# Patient Record
Sex: Female | Born: 1950 | Race: White | Hispanic: No | Marital: Single | Smoking: Never smoker
Health system: Southern US, Community
[De-identification: ages and names within clinical notes are randomized; demographics above are authoritative.]

## PROBLEM LIST (undated history)

## (undated) DIAGNOSIS — C50919 Malignant neoplasm of unspecified site of unspecified female breast: Secondary | ICD-10-CM

## (undated) DIAGNOSIS — F32A Depression, unspecified: Secondary | ICD-10-CM

## (undated) DIAGNOSIS — K219 Gastro-esophageal reflux disease without esophagitis: Secondary | ICD-10-CM

## (undated) DIAGNOSIS — F329 Major depressive disorder, single episode, unspecified: Secondary | ICD-10-CM

## (undated) HISTORY — DX: Major depressive disorder, single episode, unspecified: F32.9

## (undated) HISTORY — PX: BREAST RECONSTRUCTION: SHX9

## (undated) HISTORY — DX: Gastro-esophageal reflux disease without esophagitis: K21.9

## (undated) HISTORY — DX: Depression, unspecified: F32.A

## (undated) HISTORY — PX: OTHER SURGICAL HISTORY: SHX169

## (undated) HISTORY — PX: TUBAL LIGATION: SHX77

## (undated) HISTORY — PX: BLEPHAROPLASTY: SUR158

---

## 1998-09-07 ENCOUNTER — Emergency Department (HOSPITAL_COMMUNITY): Admission: EM | Admit: 1998-09-07 | Discharge: 1998-09-07 | Payer: Self-pay | Admitting: Emergency Medicine

## 2000-09-06 ENCOUNTER — Encounter: Payer: Self-pay | Admitting: Family Medicine

## 2000-09-06 ENCOUNTER — Encounter: Admission: RE | Admit: 2000-09-06 | Discharge: 2000-09-06 | Payer: Self-pay | Admitting: Family Medicine

## 2003-05-20 ENCOUNTER — Other Ambulatory Visit: Admission: RE | Admit: 2003-05-20 | Discharge: 2003-05-20 | Payer: Self-pay | Admitting: Family Medicine

## 2005-07-06 ENCOUNTER — Encounter: Admission: RE | Admit: 2005-07-06 | Discharge: 2005-07-06 | Payer: Self-pay | Admitting: Family Medicine

## 2005-09-26 ENCOUNTER — Other Ambulatory Visit: Admission: RE | Admit: 2005-09-26 | Discharge: 2005-09-26 | Payer: Self-pay | Admitting: Family Medicine

## 2007-03-17 ENCOUNTER — Encounter: Admission: RE | Admit: 2007-03-17 | Discharge: 2007-03-17 | Payer: Self-pay | Admitting: Family Medicine

## 2009-07-17 ENCOUNTER — Emergency Department (HOSPITAL_COMMUNITY): Admission: EM | Admit: 2009-07-17 | Discharge: 2009-07-17 | Payer: Self-pay | Admitting: Family Medicine

## 2010-02-20 ENCOUNTER — Emergency Department (HOSPITAL_COMMUNITY): Admission: EM | Admit: 2010-02-20 | Discharge: 2010-02-20 | Payer: Self-pay | Admitting: Family Medicine

## 2010-06-20 ENCOUNTER — Emergency Department (HOSPITAL_COMMUNITY): Admission: EM | Admit: 2010-06-20 | Discharge: 2010-06-20 | Payer: Self-pay | Admitting: Emergency Medicine

## 2011-11-16 ENCOUNTER — Emergency Department (INDEPENDENT_AMBULATORY_CARE_PROVIDER_SITE_OTHER)
Admission: EM | Admit: 2011-11-16 | Discharge: 2011-11-16 | Disposition: A | Discharge status: Home / Self Care | Payer: BC Managed Care – PPO | Source: Home / Self Care | Attending: Emergency Medicine | Admitting: Emergency Medicine

## 2011-11-16 ENCOUNTER — Encounter (HOSPITAL_COMMUNITY): Payer: Self-pay

## 2011-11-16 ENCOUNTER — Emergency Department (INDEPENDENT_AMBULATORY_CARE_PROVIDER_SITE_OTHER): Payer: BC Managed Care – PPO

## 2011-11-16 DIAGNOSIS — S92309A Fracture of unspecified metatarsal bone(s), unspecified foot, initial encounter for closed fracture: Secondary | ICD-10-CM

## 2011-11-16 NOTE — Discharge Instructions (Signed)
Followup with orthopedic provider as discussed within 2-4 weeks. Redischarged instructions for further guidance and refresh information that we discussed

## 2011-11-16 NOTE — ED Notes (Signed)
Pt states she rolled her lt foot yesterday around 5:30 pm.  States applied ice and elevated. Pain in lt lateral foot with movement and weight bearing.

## 2011-11-16 NOTE — ED Provider Notes (Signed)
History     CSN: [id number]  Arrival date & time 11/16/11  0854   First MD Initiated Contact with Patient 11/16/11 0900      Chief Complaint  Patient presents with  . Foot Injury    (Consider location/radiation/quality/duration/timing/severity/associated sxs/prior treatment) HPI Comments: Patient was walking and rolled her  Left foot yesterday in the afternoon. Have been applying ice and keeping her foot elevated. She has no spontaneous pain pain only occurs when she weight bears or with foot movement. Patient describes area with a visible bruise. Denies any tingling numbness.  Patient is a 61 y.o. female presenting with foot injury. The history is provided by the patient.  Foot Injury  The incident occurred yesterday. The incident occurred at home. The injury mechanism was torsion. Pain location: L foot. The pain is at a severity of 4/10. The pain is moderate. The pain has been intermittent since onset. Associated symptoms include inability to bear weight. Pertinent negatives include no numbness, no muscle weakness, no loss of sensation and no tingling. She reports no foreign bodies present. The symptoms are aggravated by activity, bearing weight and palpation. She has tried nothing for the symptoms. The treatment provided no relief.    History reviewed. No pertinent past medical history.  History reviewed. No pertinent past surgical history.  No family history on file.  History  Substance Use Topics  . Smoking status: Never Smoker   . Smokeless tobacco: Not on file  . Alcohol Use: No    OB History    Grav Para Term Preterm Abortions TAB SAB Ect Mult Living                  Review of Systems  Constitutional: Positive for activity change. Negative for fever and chills.  HENT: Negative for rhinorrhea, neck pain and neck stiffness.   Eyes: Negative for pain.  Respiratory: Negative for shortness of breath.   Genitourinary: Negative for dysuria, urgency, flank pain and  enuresis.  Musculoskeletal: Negative for back pain.  Neurological: Negative for tingling, weakness and numbness.    Allergies  Review of patient's allergies indicates no known allergies.  Home Medications  No current outpatient prescriptions on file.  BP 134/78  Pulse 79  Temp(Src) 98.9 F (37.2 C) (Oral)  Resp 16  SpO2 99%  Physical Exam  Nursing note and vitals reviewed. Constitutional: She appears well-developed and well-nourished.  Non-toxic appearance. She does not have a sickly appearance. She does not appear ill. No distress.  HENT:  Head: Normocephalic.  Eyes: Conjunctivae are normal. Right eye exhibits no discharge. Left eye exhibits no discharge. No scleral icterus.  Neck: Neck supple. No JVD present.  Abdominal: There is no tenderness.  Musculoskeletal: She exhibits tenderness.       Left ankle: She exhibits decreased range of motion, swelling and ecchymosis. She exhibits no deformity, no laceration and normal pulse. tenderness. Achilles tendon normal.       Feet:  Lymphadenopathy:    She has no cervical adenopathy.  Neurological: She is alert.  Skin: No rash noted. No erythema.    ED Course  Procedures (including critical care time)  Labs Reviewed - No data to display Dg Foot Complete Left  11/16/2011  *RADIOLOGY REPORT*  Clinical Data: Twisting injury, pain at base of fifth metatarsal.  LEFT FOOT - COMPLETE 3+ VIEW  Comparison: None.  Findings: There is a minimally-displaced fracture through the base of the left fifth metatarsal.  Overlying soft tissue swelling.  No additional  acute bony abnormality.  IMPRESSION: Fracture through the base of the left fifth metatarsal.  Original Report Authenticated By: Cyndie Chime, M.D.     1. Metatarsal bone fracture       MDM  Nondisplaced fifth metatarsal fracture after a portion of her left foot. No neurovascular deficits. Patient brought with Cam Walker instructed to followup with orthopedic Dr. to 4 weeks.  Patient agreed with treatment plan and followup care        Jimmie Molly, MD 11/16/11 1215

## 2013-06-17 HISTORY — PX: BREAST BIOPSY: SHX20

## 2013-06-18 ENCOUNTER — Other Ambulatory Visit: Payer: Self-pay | Admitting: Radiology

## 2013-06-18 DIAGNOSIS — D0511 Intraductal carcinoma in situ of right breast: Secondary | ICD-10-CM

## 2013-06-22 ENCOUNTER — Ambulatory Visit
Admission: RE | Admit: 2013-06-22 | Discharge: 2013-06-22 | Disposition: A | Discharge status: Home / Self Care | Payer: 59 | Source: Ambulatory Visit | Attending: Radiology | Admitting: Radiology

## 2013-06-22 DIAGNOSIS — D0511 Intraductal carcinoma in situ of right breast: Secondary | ICD-10-CM

## 2013-06-22 MED ORDER — GADOBENATE DIMEGLUMINE 529 MG/ML IV SOLN
14.0000 mL | Freq: Once | INTRAVENOUS | Status: AC | PRN
  Administered 2013-06-22: 14 mL via INTRAVENOUS

## 2013-06-23 ENCOUNTER — Encounter (INDEPENDENT_AMBULATORY_CARE_PROVIDER_SITE_OTHER): Payer: Self-pay | Admitting: Surgery

## 2013-06-23 ENCOUNTER — Other Ambulatory Visit (INDEPENDENT_AMBULATORY_CARE_PROVIDER_SITE_OTHER): Payer: Self-pay | Admitting: Surgery

## 2013-06-23 ENCOUNTER — Encounter (INDEPENDENT_AMBULATORY_CARE_PROVIDER_SITE_OTHER): Payer: Self-pay

## 2013-06-23 ENCOUNTER — Ambulatory Visit (INDEPENDENT_AMBULATORY_CARE_PROVIDER_SITE_OTHER): Payer: 59 | Admitting: Surgery

## 2013-06-23 VITALS — BP 142/80 | HR 76 | Temp 98.0°F | Resp 14 | Ht 66.0 in | Wt 179.8 lb

## 2013-06-23 DIAGNOSIS — C50919 Malignant neoplasm of unspecified site of unspecified female breast: Secondary | ICD-10-CM

## 2013-06-23 DIAGNOSIS — D0511 Intraductal carcinoma in situ of right breast: Secondary | ICD-10-CM | POA: Insufficient documentation

## 2013-06-23 DIAGNOSIS — C50911 Malignant neoplasm of unspecified site of right female breast: Secondary | ICD-10-CM

## 2013-06-23 NOTE — Progress Notes (Signed)
Patient ID: Kristin Lewis, female   DOB: 05/08/1951, 62 y.o.   MRN: [id number]  Chief Complaint  Patient presents with  . New Evaluation    new br ca    HPI Kristin Lewis is a 62 y.o. female.  Referred by Dr. 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 The mammogram was on 06/01/13. This showed multiple heterogeneous calcifications in the superior lateral quadrant of the right breast in the anterior depth that were indeterminate.  She had further imaging on 11/11 which showed an oval mass in the right breast at 12:00 with 2 other similar-appearing masses nearby. There is a cluster of pleomorphic calcifications at 11:00. Ultrasound showed a complex cyst at 9:00 and several other cysts at 12:00. She then underwent stereotactic biopsy on 11/12. The pathology showed DCIS.  MRI on 11/17/14Showed diffuse low level enhancement throughout the right breast which is asymmetric compared to the left. The area of concern measures 9.8 x 7.7 x 5.9 cm involving all quadrants of the breast. Lymph nodes appeared negative. Left breast was negative. She presents now for surgical evaluation. She denies any symptoms at all in either breast. No pain, no nipple discharge, no skin changes.  Past Medical History  Diagnosis Date  . Depression   . GERD (gastroesophageal reflux disease)     Past Surgical History  Procedure Laterality Date  . Tummy tuck    . Eyelid lift    Dr. Holderness  History reviewed. No pertinent family history. Negative for cancer  Social History History  Substance Use Topics  . Smoking status: Never Smoker   . Smokeless tobacco: Never Used  . Alcohol Use: Yes    No Known Allergies  Current Outpatient Prescriptions  Medication Sig Dispense Refill  . buPROPion (WELLBUTRIN SR) 150 MG 12 hr tablet Take 150 mg by mouth 2 (two) times daily.      . sertraline (ZOLOFT)  100 MG tablet Take 100 mg by mouth daily.       No current facility-administered medications for this visit.    Review of Systems Review of Systems  Constitutional: Negative for fever, chills and unexpected weight change.  HENT: Negative for congestion, hearing loss, sore throat, trouble swallowing and voice change.   Eyes: Negative for visual disturbance.  Respiratory: Negative for cough and wheezing.   Cardiovascular: Negative for chest pain, palpitations and leg swelling.  Gastrointestinal: Negative for nausea, vomiting, abdominal pain, diarrhea, constipation, blood in stool, abdominal distention and anal bleeding.  Genitourinary: Negative for hematuria, vaginal bleeding and difficulty urinating.  Musculoskeletal: Negative for arthralgias.  Skin: Negative for rash and wound.  Neurological: Negative for seizures, syncope and headaches.  Hematological: Negative for adenopathy. Does not bruise/bleed easily.  Psychiatric/Behavioral: Negative for confusion.   Menarche age 12 Menopause age 52 Nulliparous OCP 6 years   Blood pressure 142/80, pulse 76, temperature 98 F (36.7 C), temperature source Temporal, resp. rate 14, height 5' 6" (1.676 m), weight 179 lb 12.8 oz (81.557 kg).  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     Plan    We discussed her current diagnosis as  well as treatment options. Currently, the biopsy shows only DCIS. However the MRI shows a very wide area of abnormal enhancement. If we were to pursue breast conserving therapy, we would have to do further needle biopsies to rule out DCIS in the entire area of enhancement. She would have a very poor cosmetic outcome if we were to try to excise the entire area. Her risk of needing reexcision would be much higher as well.  The other surgical option would be to pursue a mastectomy with sentinel lymph node biopsy. The patient is very interested in pursuing this course of treatment. As well, she is requesting a prophylactic left mastectomy. Her current health status is excellent and she would rather have a left mastectomy with bilateral reconstruction at this time. I did inform her that this is a reasonable approach but a prophylactic mastectomy does not give any survival benefit. She has had previous plastic surgery by Dr. Holderness and wishes to consult with him regarding immediate reconstruction. She understands that at this time it is difficult to determine whether she might need chemotherapy, hormonal therapy, or possibly radiation therapy. A mastectomy would decrease her chance of needing to have radiation.  The patient has made the decision that she wants to undergo bilateral mastectomies. We will perform a right total mastectomy with sentinel lymph node biopsy and a prophylactic left mastectomy.The surgical procedure has been discussed with the patient.  Potential risks, benefits, alternative treatments, and expected outcomes have been explained.  All of the patient's questions at this time have been answered.  The likelihood of reaching the patient's treatment goal is good.  The patient understand the proposed surgical procedure and wishes to proceed. We will await Dr. Holderness' evaluation before scheduling the surgery.        Donjuan Robison K. 06/23/2013, 12:31 PM    

## 2013-06-23 NOTE — Addendum Note (Signed)
Addended by: Wynona Luna on: 06/23/2013 02:17 PM   Modules accepted: Orders

## 2013-06-25 ENCOUNTER — Encounter (INDEPENDENT_AMBULATORY_CARE_PROVIDER_SITE_OTHER): Payer: Self-pay

## 2013-06-30 ENCOUNTER — Other Ambulatory Visit: Payer: Self-pay | Admitting: Family Medicine

## 2013-06-30 DIAGNOSIS — R102 Pelvic and perineal pain: Secondary | ICD-10-CM

## 2013-07-06 ENCOUNTER — Ambulatory Visit
Admission: RE | Admit: 2013-07-06 | Discharge: 2013-07-06 | Disposition: A | Discharge status: Home / Self Care | Payer: 59 | Source: Ambulatory Visit | Attending: Family Medicine | Admitting: Family Medicine

## 2013-07-06 DIAGNOSIS — R102 Pelvic and perineal pain: Secondary | ICD-10-CM

## 2013-07-10 ENCOUNTER — Encounter (HOSPITAL_COMMUNITY)
Admission: RE | Admit: 2013-07-10 | Discharge: 2013-07-10 | Disposition: A | Discharge status: Home / Self Care | Payer: 59 | Source: Ambulatory Visit | Attending: Surgery | Admitting: Surgery

## 2013-07-10 ENCOUNTER — Encounter (HOSPITAL_COMMUNITY): Payer: Self-pay

## 2013-07-10 DIAGNOSIS — Z01812 Encounter for preprocedural laboratory examination: Secondary | ICD-10-CM | POA: Insufficient documentation

## 2013-07-10 DIAGNOSIS — Z01818 Encounter for other preprocedural examination: Secondary | ICD-10-CM | POA: Insufficient documentation

## 2013-07-10 LAB — COMPREHENSIVE METABOLIC PANEL
ALT: 23 U/L (ref 0–35)
Albumin: 3.8 g/dL (ref 3.5–5.2)
Alkaline Phosphatase: 88 U/L (ref 39–117)
Chloride: 104 mEq/L (ref 96–112)
GFR calc Af Amer: 61 mL/min — ABNORMAL LOW (ref >90)
Glucose, Bld: 99 mg/dL (ref 70–99)
Potassium: 3.6 mEq/L (ref 3.5–5.1)
Sodium: 140 mEq/L (ref 135–145)
Total Protein: 7.5 g/dL (ref 6.0–8.3)

## 2013-07-10 LAB — CBC
HCT: 38.8 % (ref 36.0–46.0)
MCHC: 34 g/dL (ref 30.0–36.0)
MCV: 88.8 fL (ref 78.0–100.0)
Platelets: 296 10*3/uL (ref 150–400)
RBC: 4.37 MIL/uL (ref 3.87–5.11)
RDW: 12.7 % (ref 11.5–15.5)
WBC: 8.7 10*3/uL (ref 4.0–10.5)

## 2013-07-10 NOTE — Pre-Procedure Instructions (Signed)
Kristin Lewis  07/10/2013   Your procedure is scheduled on: Dec.10 Report to [facility]  2 * 3 at 0900 AM.  Call this number if you have problems the morning of surgery: [phone]   Remember:   Do not eat food or drink liquids after midnight.   Take these medicines the morning of surgery with A SIP OF WATER: Zoloft and Wellbutrin  Stop taking Aspirin, Aleve, Ibuprofen, Herbel medications, Fish oil, BC's, Goody's   Do not wear jewelry, make-up or nail polish.  Do not wear lotions, powders, or perfumes. You may wear deodorant.  Do not shave 48 hours prior to surgery. Men may shave face and neck.  Do not bring valuables to the hospital.  [facility] is not responsible                  for any belongings or valuables.               Contacts, dentures or bridgework may not be worn into surgery.  Leave suitcase in the car. After surgery it may be brought to your room.  For patients admitted to the hospital, discharge time is determined by your                treatment team.               Patients discharged the day of surgery will not be allowed to drive  home.    Special Instructions: Shower using CHG 2 nights before surgery and the night before surgery.  If you shower the day of surgery use CHG.  Use special wash - you have one bottle of CHG for all showers.  You should use approximately 1/3 of the bottle for each shower.   Please read over the following fact sheets that you were given: Pain Booklet, Coughing and Deep Breathing and Surgical Site Infection Prevention

## 2013-07-10 NOTE — Progress Notes (Signed)
PCP is Dr Duanne Guess Denies seeing a cardiologist. Denies having a stress test, echo, or card cath.

## 2013-07-13 ENCOUNTER — Telehealth (INDEPENDENT_AMBULATORY_CARE_PROVIDER_SITE_OTHER): Payer: Self-pay | Admitting: General Surgery

## 2013-07-13 NOTE — Telephone Encounter (Signed)
Called patient back and talked to her about her questions that she sent me a e-mail, she wanted to know about the placement about the wire placed at,she didn't know if she comes here at the office to have that placed here, I told her that she will have that done at the hospital at 9:00, and she wanted to know about the drains. I told her that Dr Corliss Skains give her everything when she leaves the hospital and she will have to write down the amount on the paper. I also told her that Dr Corliss Skains will also let me know if she needs to come in before the 08-05-2013, to him or we will do a nurse only visit. Patient was understand all that I talked to her about and she can call anytime if she has any questions

## 2013-07-14 MED ORDER — CEFAZOLIN SODIUM-DEXTROSE 2-3 GM-% IV SOLR
2.0000 g | INTRAVENOUS | Status: AC
  Administered 2013-07-15: 2 g via INTRAVENOUS
  Filled 2013-07-14 (×2): qty 50

## 2013-07-14 MED ORDER — CHLORHEXIDINE GLUCONATE 4 % EX LIQD: 1.0000 "application " | Freq: Once | CUTANEOUS | Status: DC

## 2013-07-15 ENCOUNTER — Encounter (HOSPITAL_COMMUNITY): Payer: 59 | Admitting: Certified Registered Nurse Anesthetist

## 2013-07-15 ENCOUNTER — Encounter (HOSPITAL_COMMUNITY): Payer: Self-pay | Admitting: Certified Registered Nurse Anesthetist

## 2013-07-15 ENCOUNTER — Encounter (HOSPITAL_COMMUNITY)
Admission: RE | Disposition: A | Discharge status: Home / Self Care | Payer: Self-pay | Source: Ambulatory Visit | Attending: Surgery

## 2013-07-15 ENCOUNTER — Observation Stay (HOSPITAL_COMMUNITY)
Admission: RE | Admit: 2013-07-15 | Discharge: 2013-07-17 | Disposition: A | Discharge status: Home / Self Care | Payer: 59 | Source: Ambulatory Visit | Attending: Surgery | Admitting: Surgery

## 2013-07-15 ENCOUNTER — Inpatient Hospital Stay (HOSPITAL_COMMUNITY): Payer: 59 | Admitting: Certified Registered Nurse Anesthetist

## 2013-07-15 ENCOUNTER — Encounter (HOSPITAL_COMMUNITY)
Admission: RE | Admit: 2013-07-15 | Discharge: 2013-07-15 | Disposition: A | Discharge status: Home / Self Care | Payer: 59 | Source: Ambulatory Visit | Attending: Surgery | Admitting: Surgery

## 2013-07-15 DIAGNOSIS — Z4001 Encounter for prophylactic removal of breast: Secondary | ICD-10-CM | POA: Insufficient documentation

## 2013-07-15 DIAGNOSIS — Z853 Personal history of malignant neoplasm of breast: Secondary | ICD-10-CM

## 2013-07-15 DIAGNOSIS — D0590 Unspecified type of carcinoma in situ of unspecified breast: Secondary | ICD-10-CM

## 2013-07-15 DIAGNOSIS — D0510 Intraductal carcinoma in situ of unspecified breast: Secondary | ICD-10-CM | POA: Diagnosis present

## 2013-07-15 DIAGNOSIS — N6089 Other benign mammary dysplasias of unspecified breast: Secondary | ICD-10-CM

## 2013-07-15 DIAGNOSIS — D0511 Intraductal carcinoma in situ of right breast: Secondary | ICD-10-CM

## 2013-07-15 DIAGNOSIS — D4860 Neoplasm of uncertain behavior of unspecified breast: Secondary | ICD-10-CM

## 2013-07-15 DIAGNOSIS — R110 Nausea: Secondary | ICD-10-CM | POA: Insufficient documentation

## 2013-07-15 DIAGNOSIS — R928 Other abnormal and inconclusive findings on diagnostic imaging of breast: Secondary | ICD-10-CM | POA: Insufficient documentation

## 2013-07-15 HISTORY — PX: MASTECTOMY W/ SENTINEL NODE BIOPSY: SHX2001

## 2013-07-15 HISTORY — PX: MASTECTOMY: SHX3

## 2013-07-15 HISTORY — PX: SENTINEL LYMPH NODE BIOPSY: SHX2392

## 2013-07-15 HISTORY — DX: Malignant neoplasm of unspecified site of unspecified female breast: C50.919

## 2013-07-15 LAB — CBC
HCT: 37.1 % (ref 36.0–46.0)
Hemoglobin: 12.3 g/dL (ref 12.0–15.0)
MCH: 30.1 pg (ref 26.0–34.0)
MCHC: 33.2 g/dL (ref 30.0–36.0)
MCV: 90.7 fL (ref 78.0–100.0)
Platelets: 252 10*3/uL (ref 150–400)
RBC: 4.09 MIL/uL (ref 3.87–5.11)
RDW: 12.9 % (ref 11.5–15.5)
WBC: 15.1 10*3/uL — ABNORMAL HIGH (ref 4.0–10.5)

## 2013-07-15 LAB — CREATININE, SERUM
Creatinine, Ser: 1 mg/dL (ref 0.50–1.10)
GFR calc Af Amer: 68 mL/min — ABNORMAL LOW (ref >90)
GFR calc non Af Amer: 59 mL/min — ABNORMAL LOW (ref >90)

## 2013-07-15 SURGERY — MASTECTOMY WITH SENTINEL LYMPH NODE BIOPSY
Anesthesia: General | Site: Breast | Laterality: Bilateral

## 2013-07-15 MED ORDER — FENTANYL CITRATE 0.05 MG/ML IJ SOLN
INTRAMUSCULAR | Status: AC
  Filled 2013-07-15: qty 2

## 2013-07-15 MED ORDER — CEFAZOLIN SODIUM 1-5 GM-% IV SOLN
1.0000 g | Freq: Four times a day (QID) | INTRAVENOUS | Status: AC
  Administered 2013-07-15 – 2013-07-16 (×3): 1 g via INTRAVENOUS
  Filled 2013-07-15 (×3): qty 50

## 2013-07-15 MED ORDER — LACTATED RINGERS IV SOLN
INTRAVENOUS | Status: DC | PRN
  Administered 2013-07-15 (×2): via INTRAVENOUS

## 2013-07-15 MED ORDER — ONDANSETRON HCL 4 MG/2ML IJ SOLN: 4.0000 mg | Freq: Once | INTRAMUSCULAR | Status: DC | PRN

## 2013-07-15 MED ORDER — ENOXAPARIN SODIUM 40 MG/0.4ML ~~LOC~~ SOLN
40.0000 mg | SUBCUTANEOUS | Status: DC
  Administered 2013-07-16 – 2013-07-17 (×2): 40 mg via SUBCUTANEOUS
  Filled 2013-07-15 (×3): qty 0.4

## 2013-07-15 MED ORDER — TECHNETIUM TC 99M SULFUR COLLOID FILTERED
1.0000 | Freq: Once | INTRAVENOUS | Status: AC | PRN
  Administered 2013-07-15: 1 via INTRADERMAL

## 2013-07-15 MED ORDER — ONDANSETRON HCL 4 MG/2ML IJ SOLN
INTRAMUSCULAR | Status: DC | PRN
  Administered 2013-07-15: 4 mg via INTRAVENOUS

## 2013-07-15 MED ORDER — SODIUM CHLORIDE 0.9 % IJ SOLN
INTRAMUSCULAR | Status: DC | PRN
  Administered 2013-07-15: 13:00:00 via INTRAMUSCULAR

## 2013-07-15 MED ORDER — SERTRALINE HCL 100 MG PO TABS
100.0000 mg | ORAL_TABLET | Freq: Every day | ORAL | Status: DC
  Administered 2013-07-15 – 2013-07-17 (×3): 100 mg via ORAL
  Filled 2013-07-15 (×3): qty 1

## 2013-07-15 MED ORDER — LIDOCAINE HCL (CARDIAC) 20 MG/ML IV SOLN
INTRAVENOUS | Status: DC | PRN
  Administered 2013-07-15: 100 mg via INTRAVENOUS

## 2013-07-15 MED ORDER — MORPHINE SULFATE 2 MG/ML IJ SOLN
2.0000 mg | INTRAMUSCULAR | Status: DC | PRN
  Administered 2013-07-16: 2 mg via INTRAVENOUS
  Filled 2013-07-15: qty 1

## 2013-07-15 MED ORDER — METHYLENE BLUE 1 % INJ SOLN
INTRAMUSCULAR | Status: AC
  Filled 2013-07-15: qty 10

## 2013-07-15 MED ORDER — OXYCODONE-ACETAMINOPHEN 5-325 MG PO TABS
ORAL_TABLET | ORAL | Status: AC
  Filled 2013-07-15: qty 2

## 2013-07-15 MED ORDER — HYDROMORPHONE HCL PF 1 MG/ML IJ SOLN
0.2500 mg | INTRAMUSCULAR | Status: DC | PRN
  Administered 2013-07-15 (×2): 0.5 mg via INTRAVENOUS

## 2013-07-15 MED ORDER — LACTATED RINGERS IV SOLN
INTRAVENOUS | Status: DC
  Administered 2013-07-15: 10:00:00 via INTRAVENOUS

## 2013-07-15 MED ORDER — ONDANSETRON HCL 4 MG PO TABS: 4.0000 mg | ORAL_TABLET | Freq: Four times a day (QID) | ORAL | Status: DC | PRN

## 2013-07-15 MED ORDER — PROPOFOL 10 MG/ML IV BOLUS
INTRAVENOUS | Status: DC | PRN
  Administered 2013-07-15: 100 mg via INTRAVENOUS
  Administered 2013-07-15: 20 mg via INTRAVENOUS
  Administered 2013-07-15: 200 mg via INTRAVENOUS

## 2013-07-15 MED ORDER — KCL IN DEXTROSE-NACL 20-5-0.45 MEQ/L-%-% IV SOLN
INTRAVENOUS | Status: DC
  Administered 2013-07-15 – 2013-07-16 (×2): via INTRAVENOUS
  Filled 2013-07-15 (×3): qty 1000

## 2013-07-15 MED ORDER — HYDROMORPHONE HCL PF 1 MG/ML IJ SOLN
INTRAMUSCULAR | Status: AC
  Filled 2013-07-15: qty 1

## 2013-07-15 MED ORDER — BUPROPION HCL ER (SR) 150 MG PO TB12
150.0000 mg | ORAL_TABLET | Freq: Every day | ORAL | Status: DC
  Administered 2013-07-15 – 2013-07-17 (×3): 150 mg via ORAL
  Filled 2013-07-15 (×4): qty 1

## 2013-07-15 MED ORDER — ARTIFICIAL TEARS OP OINT
TOPICAL_OINTMENT | OPHTHALMIC | Status: DC | PRN
  Administered 2013-07-15: 1 via OPHTHALMIC

## 2013-07-15 MED ORDER — FENTANYL CITRATE 0.05 MG/ML IJ SOLN
INTRAMUSCULAR | Status: DC | PRN
  Administered 2013-07-15 (×6): 50 ug via INTRAVENOUS

## 2013-07-15 MED ORDER — GLYCOPYRROLATE 0.2 MG/ML IJ SOLN
INTRAMUSCULAR | Status: DC | PRN
  Administered 2013-07-15: .6 mg via INTRAVENOUS

## 2013-07-15 MED ORDER — KETOROLAC TROMETHAMINE 15 MG/ML IJ SOLN
30.0000 mg | Freq: Four times a day (QID) | INTRAMUSCULAR | Status: DC
  Administered 2013-07-15 – 2013-07-17 (×7): 30 mg via INTRAVENOUS
  Filled 2013-07-15 (×14): qty 2

## 2013-07-15 MED ORDER — SODIUM CHLORIDE 0.9 % IJ SOLN
INTRAMUSCULAR | Status: AC
  Filled 2013-07-15: qty 10

## 2013-07-15 MED ORDER — OXYCODONE-ACETAMINOPHEN 5-325 MG PO TABS
1.0000 | ORAL_TABLET | ORAL | Status: DC | PRN
  Administered 2013-07-15 – 2013-07-17 (×2): 2 via ORAL
  Filled 2013-07-15: qty 2

## 2013-07-15 MED ORDER — ROCURONIUM BROMIDE 100 MG/10ML IV SOLN
INTRAVENOUS | Status: DC | PRN
  Administered 2013-07-15: 30 mg via INTRAVENOUS

## 2013-07-15 MED ORDER — NEOSTIGMINE METHYLSULFATE 1 MG/ML IJ SOLN
INTRAMUSCULAR | Status: DC | PRN
  Administered 2013-07-15: 4 mg via INTRAVENOUS

## 2013-07-15 MED ORDER — 0.9 % SODIUM CHLORIDE (POUR BTL) OPTIME
TOPICAL | Status: DC | PRN
  Administered 2013-07-15: 2000 mL

## 2013-07-15 MED ORDER — EPHEDRINE SULFATE 50 MG/ML IJ SOLN
INTRAMUSCULAR | Status: DC | PRN
  Administered 2013-07-15: 10 mg via INTRAVENOUS

## 2013-07-15 MED ORDER — DEXAMETHASONE SODIUM PHOSPHATE 10 MG/ML IJ SOLN
INTRAMUSCULAR | Status: DC | PRN
  Administered 2013-07-15: 8 mg via INTRAVENOUS

## 2013-07-15 MED ORDER — ONDANSETRON HCL 4 MG/2ML IJ SOLN
4.0000 mg | Freq: Four times a day (QID) | INTRAMUSCULAR | Status: DC | PRN
  Administered 2013-07-15 – 2013-07-17 (×2): 4 mg via INTRAVENOUS
  Filled 2013-07-15 (×2): qty 2

## 2013-07-15 MED ORDER — FENTANYL CITRATE 0.05 MG/ML IJ SOLN
50.0000 ug | Freq: Once | INTRAMUSCULAR | Status: AC
  Administered 2013-07-15: 50 ug via INTRAVENOUS

## 2013-07-15 SURGICAL SUPPLY — 49 items
ADH SKN CLS APL DERMABOND .7 (GAUZE/BANDAGES/DRESSINGS) ×1
APPLIER CLIP 9.375 MED OPEN (MISCELLANEOUS)
APR CLP MED 9.3 20 MLT OPN (MISCELLANEOUS)
BINDER BREAST LRG (GAUZE/BANDAGES/DRESSINGS) ×1 IMPLANT
BINDER BREAST XLRG (GAUZE/BANDAGES/DRESSINGS) IMPLANT
CANISTER SUCTION 2500CC (MISCELLANEOUS) ×2 IMPLANT
CHLORAPREP W/TINT 26ML (MISCELLANEOUS) ×2 IMPLANT
CLIP APPLIE 9.375 MED OPEN (MISCELLANEOUS) IMPLANT
CONT SPEC 4OZ CLIKSEAL STRL BL (MISCELLANEOUS) ×3 IMPLANT
COVER PROBE W GEL 5X96 (DRAPES) ×2 IMPLANT
COVER SURGICAL LIGHT HANDLE (MISCELLANEOUS) ×2 IMPLANT
DERMABOND ADVANCED (GAUZE/BANDAGES/DRESSINGS) ×1
DERMABOND ADVANCED .7 DNX12 (GAUZE/BANDAGES/DRESSINGS) ×1 IMPLANT
DRAIN CHANNEL 19F RND (DRAIN) ×3 IMPLANT
DRAPE LAPAROSCOPIC ABDOMINAL (DRAPES) ×2 IMPLANT
DRAPE UTILITY 15X26 W/TAPE STR (DRAPE) ×5 IMPLANT
DRSG PAD ABDOMINAL 8X10 ST (GAUZE/BANDAGES/DRESSINGS) ×1 IMPLANT
ELECT BLADE 4.0 EZ CLEAN MEGAD (MISCELLANEOUS) ×2
ELECT CAUTERY BLADE 6.4 (BLADE) ×2 IMPLANT
ELECT REM PT RETURN 9FT ADLT (ELECTROSURGICAL) ×2
ELECTRODE BLDE 4.0 EZ CLN MEGD (MISCELLANEOUS) IMPLANT
ELECTRODE REM PT RTRN 9FT ADLT (ELECTROSURGICAL) ×1 IMPLANT
EVACUATOR SILICONE 100CC (DRAIN) ×3 IMPLANT
GAUZE XEROFORM 1X8 LF (GAUZE/BANDAGES/DRESSINGS) ×1 IMPLANT
GLOVE BIO SURGEON STRL SZ7 (GLOVE) ×2 IMPLANT
GLOVE BIOGEL PI IND STRL 7.5 (GLOVE) ×1 IMPLANT
GLOVE BIOGEL PI INDICATOR 7.5 (GLOVE) ×1
GLOVE SURG SS PI 7.0 STRL IVOR (GLOVE) ×1 IMPLANT
GOWN STRL NON-REIN LRG LVL3 (GOWN DISPOSABLE) ×5 IMPLANT
KIT BASIN OR (CUSTOM PROCEDURE TRAY) ×2 IMPLANT
KIT ROOM TURNOVER OR (KITS) ×2 IMPLANT
MARKER SKIN DUAL TIP RULER LAB (MISCELLANEOUS) ×2 IMPLANT
NDL 18GX1X1/2 (RX/OR ONLY) (NEEDLE) ×1 IMPLANT
NDL HYPO 25GX1X1/2 BEV (NEEDLE) ×1 IMPLANT
NEEDLE 18GX1X1/2 (RX/OR ONLY) (NEEDLE) ×2 IMPLANT
NEEDLE HYPO 25GX1X1/2 BEV (NEEDLE) ×2 IMPLANT
NS IRRIG 1000ML POUR BTL (IV SOLUTION) ×3 IMPLANT
PACK GENERAL/GYN (CUSTOM PROCEDURE TRAY) ×2 IMPLANT
PAD ARMBOARD 7.5X6 YLW CONV (MISCELLANEOUS) ×4 IMPLANT
SPECIMEN JAR LARGE (MISCELLANEOUS) ×2 IMPLANT
SPONGE GAUZE 4X4 12PLY (GAUZE/BANDAGES/DRESSINGS) ×1 IMPLANT
STAPLER VISISTAT 35W (STAPLE) ×2 IMPLANT
SUT ETHILON 2 0 FS 18 (SUTURE) ×1 IMPLANT
SUT ETHILON 3 0 FSL (SUTURE) ×2 IMPLANT
SUT VIC AB 3-0 SH 18 (SUTURE) ×3 IMPLANT
SYR CONTROL 10ML LL (SYRINGE) ×2 IMPLANT
TOWEL OR 17X24 6PK STRL BLUE (TOWEL DISPOSABLE) ×1 IMPLANT
TOWEL OR 17X26 10 PK STRL BLUE (TOWEL DISPOSABLE) ×2 IMPLANT
WATER STERILE IRR 1000ML POUR (IV SOLUTION) IMPLANT

## 2013-07-15 NOTE — Anesthesia Procedure Notes (Signed)
Procedure Name: Intubation Date/Time: 07/15/2013 12:21 PM Performed by: Angelica Pou Pre-anesthesia Checklist: Patient identified, Patient being monitored, Emergency Drugs available, Timeout performed and Suction available Patient Re-evaluated:Patient Re-evaluated prior to inductionOxygen Delivery Method: Circle system utilized Preoxygenation: Pre-oxygenation with 100% oxygen Intubation Type: IV induction Ventilation: Mask ventilation without difficulty Laryngoscope Size: Mac and 3 Grade View: Grade III Tube type: Oral Tube size: 7.0 mm Number of attempts: 1 Airway Equipment and Method: Bougie stylet Placement Confirmation: ETT inserted through vocal cords under direct vision,  breath sounds checked- equal and bilateral and positive ETCO2 Secured at: 21 cm Tube secured with: Tape Dental Injury: Teeth and Oropharynx as per pre-operative assessment

## 2013-07-15 NOTE — H&P (View-Only) (Signed)
Patient ID: Kristin Lewis, female   DOB: [birth year], 62 y.o.   MRN: [id number]  Chief Complaint  Patient presents with  . New Evaluation    new br ca    HPI Kristin Lewis is a 62 y.o. female.  Referred by Dr. 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 The mammogram was on 06/01/13. This showed multiple heterogeneous calcifications in the superior lateral quadrant of the right breast in the anterior depth that were indeterminate.  She had further imaging on 11/11 which showed an oval mass in the right breast at 12:00 with 2 other similar-appearing masses nearby. There is a cluster of pleomorphic calcifications at 11:00. Ultrasound showed a complex cyst at 9:00 and several other cysts at 12:00. She then underwent stereotactic biopsy on 11/12. The pathology showed DCIS.  MRI on 11/17/14Showed diffuse low level enhancement throughout the right breast which is asymmetric compared to the left. The area of concern measures 9.8 x 7.7 x 5.9 cm involving all quadrants of the breast. Lymph nodes appeared negative. Left breast was negative. She presents now for surgical evaluation. She denies any symptoms at all in either breast. No pain, no nipple discharge, no skin changes.  Past Medical History  Diagnosis Date  . Depression   . GERD (gastroesophageal reflux disease)     Past Surgical History  Procedure Laterality Date  . Tummy tuck    . Eyelid lift    Dr. Stephens November  History reviewed. No pertinent family history. Negative for cancer  Social History History  Substance Use Topics  . Smoking status: Never Smoker   . Smokeless tobacco: Never Used  . Alcohol Use: Yes    No Known Allergies  Current Outpatient Prescriptions  Medication Sig Dispense Refill  . buPROPion (WELLBUTRIN SR) 150 MG 12 hr tablet Take 150 mg by mouth 2 (two) times daily.      . sertraline (ZOLOFT)  100 MG tablet Take 100 mg by mouth daily.       No current facility-administered medications for this visit.    Review of Systems Review of Systems  Constitutional: Negative for fever, chills and unexpected weight change.  HENT: Negative for congestion, hearing loss, sore throat, trouble swallowing and voice change.   Eyes: Negative for visual disturbance.  Respiratory: Negative for cough and wheezing.   Cardiovascular: Negative for chest pain, palpitations and leg swelling.  Gastrointestinal: Negative for nausea, vomiting, abdominal pain, diarrhea, constipation, blood in stool, abdominal distention and anal bleeding.  Genitourinary: Negative for hematuria, vaginal bleeding and difficulty urinating.  Musculoskeletal: Negative for arthralgias.  Skin: Negative for rash and wound.  Neurological: Negative for seizures, syncope and headaches.  Hematological: Negative for adenopathy. Does not bruise/bleed easily.  Psychiatric/Behavioral: Negative for confusion.   Menarche age 72 Menopause age 56 Nulliparous OCP 6 years   Blood pressure 142/80, pulse 76, temperature 98 F (36.7 C), temperature source Temporal, resp. rate 14, height 5\' 6"  (1.676 m), weight 179 lb 12.8 oz (81.557 kg).  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     Plan    We discussed her current diagnosis as  well as treatment options. Currently, the biopsy shows only DCIS. However the MRI shows a very wide area of abnormal enhancement. If we were to pursue breast conserving therapy, we would have to do further needle biopsies to rule out DCIS in the entire area of enhancement. She would have a very poor cosmetic outcome if we were to try to excise the entire area. Her risk of needing reexcision would be much higher as well.  The other surgical option would be to pursue a mastectomy with sentinel lymph node biopsy. The patient is very interested in pursuing this course of treatment. As well, she is requesting a prophylactic left mastectomy. Her current health status is excellent and she would rather have a left mastectomy with bilateral reconstruction at this time. I did inform her that this is a reasonable approach but a prophylactic mastectomy does not give any survival benefit. She has had previous plastic surgery by Dr. Stephens November and wishes to consult with him regarding immediate reconstruction. She understands that at this time it is difficult to determine whether she might need chemotherapy, hormonal therapy, or possibly radiation therapy. A mastectomy would decrease her chance of needing to have radiation.  The patient has made the decision that she wants to undergo bilateral mastectomies. We will perform a right total mastectomy with sentinel lymph node biopsy and a prophylactic left mastectomy.The surgical procedure has been discussed with the patient.  Potential risks, benefits, alternative treatments, and expected outcomes have been explained.  All of the patient's questions at this time have been answered.  The likelihood of reaching the patient's treatment goal is good.  The patient understand the proposed surgical procedure and wishes to proceed. We will await Dr. Stephens November' evaluation before scheduling the surgery.        Haniyyah Sakuma K. 06/23/2013, 12:31 PM

## 2013-07-15 NOTE — Op Note (Signed)
Pre-op diagnosis:  Right breast DCIS Post-op diagnosis:  Same Procedure:  Blue dye injection   Right mastectomy with sentinel lymph node biopsy   Left prophylactic mastectomy Surgeon:  Hanifah Royse K. Assistant:  Dr. Chevis Pretty Anesthesia:  GETT Indications:  This is a 62 yo female who presents after recent routine screening mammogram that revealed multiple right breast calcifications.  Biopsy showed DCIS, but MRI revealed an area of 9.8 x 7.7 x 5.9 cm with diffuse enhancement.  Due to the wide area of abnormality with the possibility of widespread DCIS or invasive cancer, she has opted for mastectomy and sentinel lymph node biopsy.  She has also opted for left prophylactic mastectomy.  Description of procedure:  The patient was injected by radiology in the holding area.  She was brought to the operating room and placed in a supine position on the operating table.  After an adequate level of anesthesia was obtained, her breasts and axillae were prepped with Chloraprep and draped in sterile fashion.  We outlined an elliptical transverse incision of the left side to include the nipple-areolar complex.  The skin incision was made and we raised skin flaps with cautery.  We dissected to the chest wall at the left edge of the sternum, up to the infra-clavicular fossa, down to the inframammary crease, and laterally to the edge of the lattisimus dorsi muscle.  The breast tissue was dissected off of the pectoralis muscle with cautery and the axillary tail was amputated with cautery.  The specimen was oriented with a long suture lateral and a short suture superior.  We examined the wound and a single lymph node was easily visible in the axilla.  We took this as well and marked it as "left axillary lymph node" for pathology.  We irrigated and inspected for hemostasis.  An 50 French drain was placed through a stab incision and secured with 2-0 nylon.  The wound was closed with subcutaneous 3-0 vicryl and staples.  We  then turned our attention to the right side.    The right nipple was injected intra-dermally with methylene blue solution.  We outlined a transverse elliptical incision and raised skin flaps in similar fashion.  When we reached the axilla, the neoprobe was used to identify an area of activity.  We were able to identify two hot lymph nodes with counts of 285 and 295, but no visible blue dye was noted in the nodes.  Background activity was 11.  We dissected the breast tissue off of the pectoralis muscle and oriented it with suture similar to the left.  Another drain was placed through a stab incision.  The wound was closed in similar fashion.  Both drains were placed to bulb suction.  Dry dressings were applied.    The patient was extubated and brought to the recovery room in stable condition.  All sponge, instrument, and needle counts are correct.  Wilmon Arms. Corliss Skains, MD, [facility]  General/ Trauma Surgery  07/15/2013 2:29 PM

## 2013-07-15 NOTE — Progress Notes (Signed)
Patient admitted to  6 [facility] Room 19.  S/P bilateral mastectomy. Breast binder on. Right and left JP drains intact. Oriented to room. Call bell in reach.

## 2013-07-15 NOTE — Anesthesia Postprocedure Evaluation (Signed)
  Anesthesia Post-op Note  Patient: Kristin Lewis  Procedure(s) Performed: Procedure(s) with comments: BILATERAL MASTECTOMY WITH  RIGHT SENTINEL LYMPH NODE BIOPSY (Bilateral) - RIGHT NUC MED INJECTION 10:30  Patient Location: PACU  Anesthesia Type:General  Level of Consciousness: awake  Airway and Oxygen Therapy: Patient Spontanous Breathing  Post-op Pain: mild  Post-op Assessment: Post-op Vital signs reviewed, Patient's Cardiovascular Status Stable, Respiratory Function Stable, Patent Airway, No signs of Nausea or vomiting and Pain level controlled  Post-op Vital Signs: Reviewed and stable  Complications: No apparent anesthesia complications

## 2013-07-15 NOTE — Anesthesia Preprocedure Evaluation (Signed)
Anesthesia Evaluation  Patient identified by MRN, date of birth, ID band Patient awake    Reviewed: Allergy & Precautions, H&P , NPO status , Patient's Chart, lab work & pertinent test results  Airway       Dental   Pulmonary          Cardiovascular     Neuro/Psych Depression    GI/Hepatic GERD-  ,  Endo/Other    Renal/GU      Musculoskeletal   Abdominal   Peds  Hematology   Anesthesia Other Findings   Reproductive/Obstetrics                           Anesthesia Physical Anesthesia Plan  ASA: II  Anesthesia Plan: General   Post-op Pain Management:    Induction:   Airway Management Planned: LMA and Oral ETT  Additional Equipment:   Intra-op Plan:   Post-operative Plan: Extubation in OR  Informed Consent: I have reviewed the patients History and Physical, chart, labs and discussed the procedure including the risks, benefits and alternatives for the proposed anesthesia with the patient or authorized representative who has indicated his/her understanding and acceptance.     Plan Discussed with:   Anesthesia Plan Comments:         Anesthesia Quick Evaluation

## 2013-07-15 NOTE — Progress Notes (Signed)
Dr. Katrinka Blazing stated to put iv in left arm.

## 2013-07-15 NOTE — Transfer of Care (Signed)
Immediate Anesthesia Transfer of Care Note  Patient: Kristin Lewis  Procedure(s) Performed: Procedure(s) with comments: BILATERAL MASTECTOMY WITH  RIGHT SENTINEL LYMPH NODE BIOPSY (Bilateral) - RIGHT NUC MED INJECTION 10:30  Patient Location: PACU  Anesthesia Type:General  Level of Consciousness: awake, alert , oriented and patient cooperative  Airway & Oxygen Therapy: Patient Spontanous Breathing and Patient connected to nasal cannula oxygen  Post-op Assessment: Report given to PACU RN, Post -op Vital signs reviewed and stable and Patient moving all extremities  Post vital signs: Reviewed and stable  Complications: No apparent anesthesia complications

## 2013-07-15 NOTE — Preoperative (Signed)
Beta Blockers   Reason not to administer Beta Blockers:Not Applicable 

## 2013-07-15 NOTE — Interval H&P Note (Signed)
History and Physical Interval Note:  07/15/2013 11:31 AM  Kristin Lewis  has presented today for surgery, with the diagnosis of right breast DCIS   The various methods of treatment have been discussed with the patient and family. After consideration of risks, benefits and other options for treatment, the patient has consented to  Procedure(s) with comments: BILATERAL MASTECTOMY WITH  RIGHT SENTINEL LYMPH NODE BIOPSY (Bilateral) - RIGHT NUC MED INJECTION 10:30 as a surgical intervention .  The patient's history has been reviewed, patient examined, no change in status, stable for surgery.  I have reviewed the patient's chart and labs.  Questions were answered to the patient's satisfaction.     Corrigan Kretschmer K.

## 2013-07-16 ENCOUNTER — Encounter (HOSPITAL_COMMUNITY): Payer: Self-pay | Admitting: Surgery

## 2013-07-16 LAB — BASIC METABOLIC PANEL
CO2: 23 mEq/L (ref 19–32)
Calcium: 7.9 mg/dL — ABNORMAL LOW (ref 8.4–10.5)
Chloride: 103 mEq/L (ref 96–112)
GFR calc Af Amer: 82 mL/min — ABNORMAL LOW (ref >90)
GFR calc non Af Amer: 71 mL/min — ABNORMAL LOW (ref >90)
Glucose, Bld: 175 mg/dL — ABNORMAL HIGH (ref 70–99)
Potassium: 3.8 mEq/L (ref 3.5–5.1)
Sodium: 134 mEq/L — ABNORMAL LOW (ref 135–145)

## 2013-07-16 LAB — CBC
Hemoglobin: 11 g/dL — ABNORMAL LOW (ref 12.0–15.0)
MCH: 29.8 pg (ref 26.0–34.0)
RBC: 3.69 MIL/uL — ABNORMAL LOW (ref 3.87–5.11)
RDW: 13 % (ref 11.5–15.5)
WBC: 11.9 10*3/uL — ABNORMAL HIGH (ref 4.0–10.5)

## 2013-07-16 NOTE — Progress Notes (Signed)
1 Day Post-Op  Subjective: Sore, but doing well Nausea resolved  Objective: Vital signs in last 24 hours: Temp:  [97.5 F (36.4 C)-98.4 F (36.9 C)] 98 F (36.7 C) (12/11 0949) Pulse Rate:  [56-74] 66 (12/11 0949) Resp:  [10-19] 16 (12/11 0949) BP: (98-155)/(48-82) 133/71 mmHg (12/11 0949) SpO2:  [90 %-100 %] 99 % (12/11 0949) Last BM Date: 07/14/13  Intake/Output from previous day: 12/10 0701 - 12/11 0700 In: 1500 [I.V.:1500] Out: 580 [Urine:400; Drains:180] Intake/Output this shift:    General appearance: alert, cooperative and no distress Chest wall: no tenderness, Mild bruising below inframammary crease on right; incisions c/d/i on left; skin flaps viable Drains - serosanguinous output  Lab Results:   Recent Labs  07/15/13 1925 07/16/13 0405  WBC 15.1* 11.9*  HGB 12.3 11.0*  HCT 37.1 33.3*  PLT 252 239   BMET  Recent Labs  07/15/13 1925 07/16/13 0405  NA  --  134*  K  --  3.8  CL  --  103  CO2  --  23  GLUCOSE  --  175*  BUN  --  15  CREATININE 1.00 0.86  CALCIUM  --  7.9*   PT/INR No results found for this basename: LABPROT, INR,  in the last 72 hours ABG No results found for this basename: PHART, PCO2, PO2, HCO3,  in the last 72 hours  Studies/Results: Nm Sentinel Node Inj-no Rpt (breast)  07/15/2013   CLINICAL DATA: Right breast DCIS   Sulfur colloid was injected intradermally by the nuclear medicine  technologist for breast cancer sentinel node localization.     Anti-infectives: Anti-infectives   Start     Dose/Rate Route Frequency Ordered Stop   07/15/13 1800  ceFAZolin (ANCEF) IVPB 1 g/50 mL premix     1 g 100 mL/hr over 30 Minutes Intravenous Every 6 hours 07/15/13 1605 07/16/13 0629   07/15/13 0600  ceFAZolin (ANCEF) IVPB 2 g/50 mL premix     2 g 100 mL/hr over 30 Minutes Intravenous On call to O.R. 07/14/13 1443 07/15/13 1225      Assessment/Plan: s/p Procedure(s) with comments: BILATERAL MASTECTOMY WITH  RIGHT SENTINEL LYMPH  NODE BIOPSY (Bilateral) - RIGHT NUC MED INJECTION 10:30 Plan for discharge tomorrow Ambulate PO pain meds   LOS: 1 day    Fareeda Downard K. 07/16/2013

## 2013-07-16 NOTE — Progress Notes (Signed)
UR completed 

## 2013-07-17 MED ORDER — BISACODYL 10 MG RE SUPP
10.0000 mg | Freq: Once | RECTAL | Status: AC
  Administered 2013-07-17: 10 mg via RECTAL
  Filled 2013-07-17: qty 1

## 2013-07-17 MED ORDER — OXYCODONE-ACETAMINOPHEN 5-325 MG PO TABS: 1.0000 | ORAL_TABLET | ORAL | Status: DC | PRN

## 2013-07-17 NOTE — Progress Notes (Signed)
DC instructions gone over with patient, questions answered, verbalized understanding.  Reviewed with patient proper care of drain and importance of recording amounts for surgeon.  Rx given for Percocet.  Patient transported via wheelchair to front of hospital accompanied by RN to be taken home by significant other.  Patient in good condition upon leaving [facility].

## 2013-07-17 NOTE — Discharge Summary (Signed)
Physician Discharge Summary  Patient ID: Kristin Lewis MRN: [id number] DOB/AGE: Jun 19, 1951 62 y.o.  Admit date: 07/15/2013 Discharge date: 07/17/2013  Admission Diagnoses:  Right breast DCIS  Discharge Diagnoses: Right breast DCIS Active Problems:   DCIS (ductal carcinoma in situ) of breast   Discharged Condition: good  Hospital Course: The patient underwent right mastectomy with sentinel lymph node biopsy, as well as prophylactic left mastectomy on 07/15/13.  She had some issues with pain control which improved with Toradol.  Her drains are functioning.  She feels better on POD#2 except for some constipation.  Consults: None  Significant Diagnostic Studies: pathology pending  Treatments: surgery: as above  Discharge Exam: Blood pressure 144/71, pulse 54, temperature 98.1 F (36.7 C), temperature source Oral, resp. rate 18, SpO2 95.00%. General appearance: alert, cooperative and no distress Chest wall: no tenderness, staple lines intact; flaps viable; mild bruising on right side at inframammary crease Abd - minimally distended: + BS  Disposition: 01-Home or Self Care  Discharge Orders   Future Appointments Provider Department Dept Phone   08/05/2013 9:40 AM Wilmon Arms. Bexley Mclester, MD [facility], [place] [phone]   Future Orders Complete By Expires   Call MD for:  persistant nausea and vomiting  As directed    Call MD for:  redness, tenderness, or signs of infection (pain, swelling, redness, odor or green/yellow discharge around incision site)  As directed    Call MD for:  severe uncontrolled pain  As directed    Call MD for:  temperature >100.4  As directed    Change dressing (specify)  As directed    Comments:     Dressing change:Dry gauze over mastectomy incisions and around drainage tubes - change daily  Sponge baths only until drains are removed.   Diet general  As directed    Discharge wound care:  As directed    Comments:     Please teach patient to  empty and record drain output   Driving Restrictions  As directed    Comments:     Do not drive while taking pain medications   Increase activity slowly  As directed    May shower / Bathe  As directed    May walk up steps  As directed        Medication List         buPROPion 150 MG 12 hr tablet  Commonly known as:  WELLBUTRIN SR  Take 150 mg by mouth daily.     oxyCODONE-acetaminophen 5-325 MG per tablet  Commonly known as:  PERCOCET/ROXICET  Take 1-2 tablets by mouth every 4 (four) hours as needed for moderate pain.     sertraline 100 MG tablet  Commonly known as:  ZOLOFT  Take 100 mg by mouth daily.           Follow-up Information   Follow up with Kyung Muto K., MD In 5 days. (My office will call you to schedule)    Specialty:  General Surgery   Contact information:   [address] [place] [address] [phone]       Signed: Wynona Lewis. 07/17/2013, 8:23 AM

## 2013-07-21 ENCOUNTER — Ambulatory Visit (INDEPENDENT_AMBULATORY_CARE_PROVIDER_SITE_OTHER): Payer: 59 | Admitting: Surgery

## 2013-07-21 ENCOUNTER — Encounter (INDEPENDENT_AMBULATORY_CARE_PROVIDER_SITE_OTHER): Payer: Self-pay | Admitting: Surgery

## 2013-07-21 ENCOUNTER — Other Ambulatory Visit (INDEPENDENT_AMBULATORY_CARE_PROVIDER_SITE_OTHER): Payer: Self-pay | Admitting: Surgery

## 2013-07-21 VITALS — BP 125/80 | HR 77 | Temp 99.3°F | Resp 16 | Ht 65.5 in | Wt 174.8 lb

## 2013-07-21 DIAGNOSIS — C50912 Malignant neoplasm of unspecified site of left female breast: Secondary | ICD-10-CM

## 2013-07-21 DIAGNOSIS — D0590 Unspecified type of carcinoma in situ of unspecified breast: Secondary | ICD-10-CM

## 2013-07-21 DIAGNOSIS — D0511 Intraductal carcinoma in situ of right breast: Secondary | ICD-10-CM

## 2013-07-21 NOTE — Progress Notes (Signed)
The patient is doing well after her left prophylactic mastectomy and a right mastectomy with sentinel lymph node biopsy on 07/15/13. She is using minimal pain medication. She has full use of her arms. The left drain is only putting out about 20 cc a day. The right drain is putting out 50-60 cc each day. Both incisions look good. She is tolerating a regular diet. She is having regular bowel movements.  The pathology showed 2 negative sentinel lymph nodes on the right. The mastectomy on the right showed DCIS spanning 10.2 cm. There is no sign of carcinoma. Margins are negative. The left breast showed  Atypical lobular hyperplasia and had one negative lymph node. We removed the left drain today with no difficulty. A dry dressing was placed. We will recheck her in 3 days to hopefully remove the right drain. Will also refer her to the cancer center for medical oncology evaluation.  Wilmon Arms. Corliss Skains, MD, [facility] Surgery  General/ Trauma Surgery  07/21/2013 5:22 PM

## 2013-07-22 ENCOUNTER — Encounter: Payer: Self-pay | Admitting: *Deleted

## 2013-07-22 NOTE — Progress Notes (Signed)
Received referral in EPIC.  Sent request to Dr. Welton Flakes for a date and time.  Emailed Annie at [facility] to make her aware.

## 2013-07-23 ENCOUNTER — Telehealth: Payer: Self-pay | Admitting: *Deleted

## 2013-07-23 NOTE — Telephone Encounter (Signed)
Obtained an appt from Dr. Welton Flakes and I called and confirmed 08/17/13 appt w/ pt.  Mailed before appt letter, welcome packet & intake form to pt.  Emailed Annie at [facility] to make her aware.  Took paperwork to Med Rec for chart.

## 2013-07-24 ENCOUNTER — Ambulatory Visit (INDEPENDENT_AMBULATORY_CARE_PROVIDER_SITE_OTHER): Payer: 59 | Admitting: Surgery

## 2013-07-24 ENCOUNTER — Encounter (INDEPENDENT_AMBULATORY_CARE_PROVIDER_SITE_OTHER): Payer: Self-pay | Admitting: Surgery

## 2013-07-24 VITALS — BP 114/86 | HR 83 | Temp 98.6°F | Resp 14 | Ht 65.5 in | Wt 172.2 lb

## 2013-07-24 DIAGNOSIS — D0511 Intraductal carcinoma in situ of right breast: Secondary | ICD-10-CM

## 2013-07-24 DIAGNOSIS — D0590 Unspecified type of carcinoma in situ of unspecified breast: Secondary | ICD-10-CM

## 2013-07-24 NOTE — Progress Notes (Signed)
She is doing quite well. The drain is still putting out about 90 cc per day. The left side has a small amount of seroma laterally. Both incisions look good. There is a small 1 cm patch of skin in the middle of the right mastectomy incision that may slough. However there is no sign of infection. We removed the staples and replaced with Steri-Strips. We will see her early next week for possible drain removal.  Wilmon Arms. Corliss Skains, MD, [facility]  General/ Trauma Surgery  07/24/2013 10:56 AM

## 2013-07-28 ENCOUNTER — Other Ambulatory Visit: Payer: Self-pay | Admitting: *Deleted

## 2013-07-28 ENCOUNTER — Encounter (INDEPENDENT_AMBULATORY_CARE_PROVIDER_SITE_OTHER): Payer: Self-pay | Admitting: Surgery

## 2013-07-28 ENCOUNTER — Encounter: Payer: Self-pay | Admitting: *Deleted

## 2013-07-28 ENCOUNTER — Ambulatory Visit (INDEPENDENT_AMBULATORY_CARE_PROVIDER_SITE_OTHER): Payer: 59 | Admitting: Surgery

## 2013-07-28 DIAGNOSIS — D0511 Intraductal carcinoma in situ of right breast: Secondary | ICD-10-CM

## 2013-07-28 DIAGNOSIS — C50411 Malignant neoplasm of upper-outer quadrant of right female breast: Secondary | ICD-10-CM

## 2013-07-28 DIAGNOSIS — D0590 Unspecified type of carcinoma in situ of unspecified breast: Secondary | ICD-10-CM

## 2013-07-28 NOTE — Progress Notes (Signed)
Completed chart and gave to Keisha to enter labs and return to me. 

## 2013-07-28 NOTE — Progress Notes (Signed)
She comes in today for another wound check. Both incisions seem to be healing well. The left mastectomy site seems to have accumulated some fluid. We aspirated 120 cc of serosanguineous fluid. The right side seems to be healing well. The skin flaps are viable. The drain is still putting out about 60 cc a day but the skin flap seem to be scarring down well. I went ahead remove the drain as they've been in for over a week. We will recheck her in one week.  Wilmon Arms. Corliss Skains, MD, [facility] Surgery  General/ Trauma Surgery  07/28/2013 10:16 AM

## 2013-07-29 ENCOUNTER — Encounter: Payer: Self-pay | Admitting: *Deleted

## 2013-07-29 NOTE — Progress Notes (Signed)
Received chart back from [facility] & placed in Dr. Milta Deiters box.

## 2013-08-04 ENCOUNTER — Encounter (INDEPENDENT_AMBULATORY_CARE_PROVIDER_SITE_OTHER): Payer: 59 | Admitting: Surgery

## 2013-08-05 ENCOUNTER — Encounter (INDEPENDENT_AMBULATORY_CARE_PROVIDER_SITE_OTHER): Payer: Self-pay | Admitting: Surgery

## 2013-08-05 ENCOUNTER — Ambulatory Visit (INDEPENDENT_AMBULATORY_CARE_PROVIDER_SITE_OTHER): Payer: 59 | Admitting: Surgery

## 2013-08-05 VITALS — BP 130/84 | HR 71 | Temp 97.6°F | Resp 16 | Ht 66.0 in | Wt 173.8 lb

## 2013-08-05 DIAGNOSIS — C50919 Malignant neoplasm of unspecified site of unspecified female breast: Secondary | ICD-10-CM

## 2013-08-05 DIAGNOSIS — D0511 Intraductal carcinoma in situ of right breast: Secondary | ICD-10-CM

## 2013-08-05 NOTE — Progress Notes (Signed)
The patient comes in for another wound check. Both incisions are well-healed with no sign of infection. She has re\re accumulated seroma on both sides right greater than left. We aspirated 280 cc of straw-colored fluid from the right side and 60 cc from the left. The patient tolerated this well. The Steri-Strips are beginning to come off. She should continue wearing her breast binder. We will reevaluate in one week. She has an appointment with oncology on 08/17/13.  Wilmon Arms. Corliss Skains, MD, [facility] Surgery  General/ Trauma Surgery  08/05/2013 10:21 AM

## 2013-08-10 ENCOUNTER — Encounter (INDEPENDENT_AMBULATORY_CARE_PROVIDER_SITE_OTHER): Payer: 59 | Admitting: Surgery

## 2013-08-11 ENCOUNTER — Telehealth (INDEPENDENT_AMBULATORY_CARE_PROVIDER_SITE_OTHER): Payer: Self-pay | Admitting: General Surgery

## 2013-08-11 NOTE — Telephone Encounter (Signed)
LMOM for patient to call back and ask for Khrystina Bonnes 

## 2013-08-13 ENCOUNTER — Ambulatory Visit (INDEPENDENT_AMBULATORY_CARE_PROVIDER_SITE_OTHER): Payer: 59 | Admitting: Surgery

## 2013-08-13 ENCOUNTER — Encounter (INDEPENDENT_AMBULATORY_CARE_PROVIDER_SITE_OTHER): Payer: Self-pay | Admitting: Surgery

## 2013-08-13 VITALS — BP 133/79 | HR 68 | Temp 98.7°F | Resp 18 | Ht 66.0 in | Wt 173.4 lb

## 2013-08-13 DIAGNOSIS — C50919 Malignant neoplasm of unspecified site of unspecified female breast: Secondary | ICD-10-CM

## 2013-08-13 DIAGNOSIS — D0511 Intraductal carcinoma in situ of right breast: Secondary | ICD-10-CM

## 2013-08-13 NOTE — Progress Notes (Signed)
Her incisions are healing well.  Her right mastectomy site shows no significant amount of seroma.  The left side has some seroma fluid, so we aspirated about 120 ml of straw-colored fluid without difficulty. The seroma completely resolved.    She has consulted with Dr. Dessie Coma regarding upcoming reconstruction with implants.  She has an appointment with Dr. Humphrey Rolls next week to review her pathology and to determine if there is any need for further therapy.  If there is not, she may proceed with surgical planning with Dr. Dessie Coma.  She may return to her job after 08/21/13.  We will see her one more time on 08/25/13, and then will see her at 6 month intervals.  Kristin Lewis. Kristin Dover, MD, [facility] Surgery  General/ Trauma Surgery  08/13/2013 5:30 PM

## 2013-08-17 ENCOUNTER — Other Ambulatory Visit (HOSPITAL_BASED_OUTPATIENT_CLINIC_OR_DEPARTMENT_OTHER): Payer: 59

## 2013-08-17 ENCOUNTER — Ambulatory Visit (HOSPITAL_BASED_OUTPATIENT_CLINIC_OR_DEPARTMENT_OTHER): Payer: 59 | Admitting: Oncology

## 2013-08-17 ENCOUNTER — Telehealth: Payer: Self-pay | Admitting: Oncology

## 2013-08-17 ENCOUNTER — Encounter: Payer: Self-pay | Admitting: Oncology

## 2013-08-17 ENCOUNTER — Encounter: Payer: Self-pay | Admitting: *Deleted

## 2013-08-17 ENCOUNTER — Ambulatory Visit: Payer: 59

## 2013-08-17 VITALS — BP 151/88 | HR 77 | Temp 98.2°F | Resp 20 | Ht 66.0 in | Wt 172.9 lb

## 2013-08-17 DIAGNOSIS — C50411 Malignant neoplasm of upper-outer quadrant of right female breast: Secondary | ICD-10-CM

## 2013-08-17 DIAGNOSIS — D0590 Unspecified type of carcinoma in situ of unspecified breast: Secondary | ICD-10-CM

## 2013-08-17 DIAGNOSIS — Z9010 Acquired absence of unspecified breast and nipple: Secondary | ICD-10-CM

## 2013-08-17 DIAGNOSIS — Z170 Estrogen receptor positive status [ER+]: Secondary | ICD-10-CM

## 2013-08-17 DIAGNOSIS — C50419 Malignant neoplasm of upper-outer quadrant of unspecified female breast: Secondary | ICD-10-CM

## 2013-08-17 DIAGNOSIS — D0511 Intraductal carcinoma in situ of right breast: Secondary | ICD-10-CM

## 2013-08-17 LAB — COMPREHENSIVE METABOLIC PANEL (CC13)
ALT: 28 U/L (ref 0–55)
AST: 22 U/L (ref 5–34)
Albumin: 3.9 g/dL (ref 3.5–5.0)
Alkaline Phosphatase: 100 U/L (ref 40–150)
Anion Gap: 11 mEq/L (ref 3–11)
BILIRUBIN TOTAL: 0.26 mg/dL (ref 0.20–1.20)
BUN: 20.2 mg/dL (ref 7.0–26.0)
CO2: 20 mEq/L — ABNORMAL LOW (ref 22–29)
Calcium: 9.2 mg/dL (ref 8.4–10.4)
Chloride: 109 mEq/L (ref 98–109)
Creatinine: 1.3 mg/dL — ABNORMAL HIGH (ref 0.6–1.1)
Glucose: 128 mg/dl (ref 70–140)
Potassium: 3.9 mEq/L (ref 3.5–5.1)
SODIUM: 140 meq/L (ref 136–145)
Total Protein: 7.2 g/dL (ref 6.4–8.3)

## 2013-08-17 LAB — CBC WITH DIFFERENTIAL/PLATELET
BASO%: 0.9 % (ref 0.0–2.0)
Basophils Absolute: 0.1 10*3/uL (ref 0.0–0.1)
EOS%: 2.8 % (ref 0.0–7.0)
Eosinophils Absolute: 0.3 10*3/uL (ref 0.0–0.5)
HCT: 38.5 % (ref 34.8–46.6)
HGB: 12.9 g/dL (ref 11.6–15.9)
LYMPH%: 30.2 % (ref 14.0–49.7)
MCH: 30.2 pg (ref 25.1–34.0)
MCHC: 33.6 g/dL (ref 31.5–36.0)
MCV: 89.9 fL (ref 79.5–101.0)
MONO#: 0.5 10*3/uL (ref 0.1–0.9)
MONO%: 5.1 % (ref 0.0–14.0)
NEUT#: 5.8 10*3/uL (ref 1.5–6.5)
NEUT%: 61 % (ref 38.4–76.8)
Platelets: 306 10*3/uL (ref 145–400)
RBC: 4.29 10*6/uL (ref 3.70–5.45)
RDW: 13.2 % (ref 11.2–14.5)
WBC: 9.4 10*3/uL (ref 3.9–10.3)
lymph#: 2.9 10*3/uL (ref 0.9–3.3)

## 2013-08-17 NOTE — Progress Notes (Signed)
Checked in new patient with no financial issues. She has her appt card and [facility] forms

## 2013-08-17 NOTE — Telephone Encounter (Signed)
, °

## 2013-08-17 NOTE — Progress Notes (Signed)
Kristin Lewis [id number] [birth year] 63 y.o. 08/17/2013 3:43 PM  CC  DEWEY,ELIZABETH, MD [address] [place] [address] Dr. Jaymes Graff  REASON FOR CONSULTATION:  63 year old female with right ductal carcinoma in situ and left lobular neoplasia (atypical lobular hyperplasia)   STAGE:   DCIS (ductal carcinoma in situ) of breast   Primary site: Breast (Right)   Staging method: AJCC 7th Edition   Clinical: Stage 0 (Tis (DCIS), N0, cM0)   Pathologic free text: Tis   Pathologic: Stage 0 (Tis (DCIS)) signed by Deatra Robinson, MD on 08/17/2013  3:37 PM   Summary: Stage 0 (Tis (DCIS), N0, cM0)  REFERRING PHYSICIAN: Dr. Gershon Crane  HISTORY OF PRESENT ILLNESS:  Kristin Lewis is a 63 y.o. female.  Underwent a routine screening mammogram that revealed multiple right breast calcifications. She had a biopsy performed that showed DCIS but MRI revealed an area of 9.8 x 7.7 x 5.9 cm diffuse enhancement.  Patient is status post bilateral mastectomy with. Right mastectomy with sentinel lymph node biopsy on 07/15/2013 with a left prophylactic mastectomy. The final pathology revealed on the right side DCIS with calcifications high-grade spanning 10.2 cm. 2 sentinel nodes were negative for metastatic disease. All surgical resection margins were negative for carcinoma. Left simple mastectomy revealed atypical lobular hyperplasia Sentinel nodes were negative. Postoperatively patient is doing well. She is now seen in medical oncology for discussion of treatment options. Of note DCIS was ER positive PR negative.   Past Medical History: Past Medical History  Diagnosis Date  . Depression   . GERD (gastroesophageal reflux disease)   . Breast cancer     "right only" (07/15/2013)    Past Surgical History: Past Surgical History  Procedure Laterality Date  . Tummy tuck    . Blepharoplasty Bilateral   . Tubal ligation    . Sentinel lymph node biopsy Right 07/15/2013  . Mastectomy Bilateral  07/15/2013    prophylactic left mastectomy/notes 06/23/2013  . Breast biopsy  06/17/2013    sterotactic/notes 06/23/2013  . Mastectomy w/ sentinel node biopsy Bilateral 07/15/2013    Procedure: BILATERAL MASTECTOMY WITH  RIGHT SENTINEL LYMPH NODE BIOPSY;  Surgeon: Imogene Burn. [name], MD;  Location: [facility];  Service: General;  Laterality: Bilateral;  RIGHT NUC MED INJECTION 10:30    Family History: History reviewed. No pertinent family history.  Social History History  Substance Use Topics  . Smoking status: Never Smoker   . Smokeless tobacco: Never Used  . Alcohol Use: 0.6 oz/week    1 Shots of liquor per week     Comment: less than 1 per week    Allergies: No Known Allergies  Current Medications: Current Outpatient Prescriptions  Medication Sig Dispense Refill  . acetaminophen (TYLENOL) 500 MG tablet Take 500 mg by mouth every 4 (four) hours as needed.      Marland Kitchen buPROPion (WELLBUTRIN SR) 150 MG 12 hr tablet Take 150 mg by mouth daily.       Marland Kitchen oxyCODONE-acetaminophen (PERCOCET/ROXICET) 5-325 MG per tablet Take 1-2 tablets by mouth every 4 (four) hours as needed for moderate pain.  30 tablet  0  . sertraline (ZOLOFT) 100 MG tablet Take 100 mg by mouth daily.       No current facility-administered medications for this visit.    OB/GYN History: menarche at 9, menopause at 74, no HRT, nulliparous,   Fertility Discussion: no Prior History of Cancer: skin basal cell  Health Maintenance:  Colonoscopy no Bone Density yes in  10 years Last PAP smear 2014  ECOG PERFORMANCE STATUS: 0 - Asymptomatic  Genetic Counseling/testing: no  REVIEW OF SYSTEMS:  14 point review of systems was taken and a rescan separately into the electronic medical record  PHYSICAL EXAMINATION: Blood pressure 151/88, pulse 77, temperature 98.2 F (36.8 C), temperature source Oral, resp. rate 20, height 5\' 6"  (1.676 m), weight 172 lb 14.4 oz (78.427 kg).  EUM:PNTIR, no distress, well nourished and well  developed SKIN: skin color, texture, turgor are normal, no rashes or significant lesions HEAD: Normocephalic EYES: PERRLA, EOMI, Conjunctiva are pink and non-injected EARS: External ears normal OROPHARYNX:no exudate, no erythema and lips, buccal mucosa, and tongue normal  NECK: supple, no adenopathy LYMPH:  no palpable lymphadenopathy BREAST:post-mastectomy site well healed and free of suspicious changes LUNGS: clear to auscultation  HEART: regular rate & rhythm ABDOMEN:abdomen soft, non-tender, normal bowel sounds and no masses or organomegaly BACK: Back symmetric, no curvature. EXTREMITIES:no edema, no clubbing, no cyanosis  NEURO: alert & oriented x 3 with fluent speech, no focal motor/sensory deficits, gait normal     STUDIES/RESULTS: No results found.   LABS:    Chemistry      Component Value Date/Time   NA 134* 07/16/2013 0405   K 3.8 07/16/2013 0405   CL 103 07/16/2013 0405   CO2 23 07/16/2013 0405   BUN 15 07/16/2013 0405   CREATININE 0.86 07/16/2013 0405      Component Value Date/Time   CALCIUM 7.9* 07/16/2013 0405   ALKPHOS 88 07/10/2013 1256   AST 21 07/10/2013 1256   ALT 23 07/10/2013 1256   BILITOT 0.2* 07/10/2013 1256      Lab Results  Component Value Date   WBC 9.4 08/17/2013   HGB 12.9 08/17/2013   HCT 38.5 08/17/2013   MCV 89.9 08/17/2013   PLT 306 08/17/2013   Diagnosis 1. Breast, simple mastectomy, Left - LOBULAR NEOPLASIA (ATYPICAL LOBULAR HYPERPLASIA), WITH CALCIFICATIONS. - FIBROCYSTIC CHANGES WITH CALCIFICATIONS. 2. Lymph node, biopsy, Left axillary - THERE IS NO EVIDENCE OF CARCINOMA IN 1 OF 1 LYMPH NODE (0/1). 3. Lymph node, sentinel, biopsy, Right axillary #1 - THERE IS NO EVIDENCE OF CARCINOMA IN 1 OF 1 LYMPH NODE (0/1). 4. Lymph node, sentinel, biopsy, Right axillary #2 - THERE IS NO EVIDENCE OF CARCINOMA IN 1 OF 1 LYMPH NODE (0/1). 5. Breast, simple mastectomy, Right - DUCTAL CARCINOMA IN SITU WITH CALCIFICATIONS, HIGH GRADE, SPANNING  10.2 CM. - THERE IS NO EVIDENCE OF CARCINOMA IN 2 OF 2 LYMPH NODES (0/2). - THE SURGICAL RESECTION MARGINS ARE NEGATIVE FOR CARCINOMA. - SEE ONCOLOGY TABLE BELOW. Microscopic Comment 5. BREAST, IN SITU CARCINOMA Specimen, including laterality: Right breast. Procedure (include lymph node sampling sentinel-non-sentinel: Simple mastectomy and node dissection. Grade of carcinoma: High grade. Necrosis: Present. Estimated tumor size: (gross measurement): 10.2 cm Treatment effect: N/A Distance to closest margin: 1.6 cm to the anterior soft tissue margin (gross measurement). Breast prognostic profile: [id number] Estrogen receptor: 100%, moderate staining intensity. Progesterone receptor: 0%. Lymph nodes: Examined: 2 Sentinel 1 of 3 Duplicate copy FINAL for RAYNA, BRENNER [phone]) Microscopic Comment(continued) 2 Non-sentinel 4 Total Lymph nodes with metastasis: 0 TNM: pTis, pN0 (JBK:gt, 07/17/13) JOSHUA  ASSESSMENT    63 year old female with a screen detected ductal carcinoma in situ of the right breast. She is now status post bilateral mastectomies. Prophylactic left mastectomy and therapeutic right mastectomy. Right mastectomy revealed a 10.2 cm ductal carcinoma in situ with calcifications high-grade sentinel nodes were negative for metastatic disease  all resection margins were negative. Tumor was ER +100% PR negative. Postoperatively patient seems to be doing well. She is continuing to be seen by her surgeon. Patient and I discussed her pathology in detail. We discussed pathophysiology and treatment options in breast cancer. We specifically discussed ductal carcinoma in situ. We discussed chemoprevention with antiestrogen therapy. However patient does have bilateral mastectomies. I do not think that there is any role for prevention with tamoxifen at this time. We however did discuss risks benefits and potential side effects of tamoxifen and aromatase inhibitors. She demonstrated  understanding.   Clinical Trial Eligibility:no Multidisciplinary conference discussion yes     PLAN:    At this time my recommendation is to follow the patient and on an annual basis for the first 5 years. No role for adjuvant antiestrogen therapy since patient has had bilateral mastectomies. We still discussed the possibility of putting her on something such as tamoxifen in case there is some residual breast tissue. However I do think that the risks will far outweigh the benefits. Patient also is in agreement with this. Therefore I will see the patient back on an annual basis for the first 5 years        Discussion: Patient is being treated per NCCN breast cancer care guidelines appropriate for stage.0   Thank you so much for allowing me to participate in the care of Kristin Lewis. I will continue to follow up the patient with you and assist in her care.  All questions were answered. The patient knows to call the clinic with any problems, questions or concerns. We can certainly see the patient much sooner if necessary.  I spent 30 minutes counseling the patient face to face. The total time spent in the appointment was 60 minutes.  Marcy Panning, MD Medical/Oncology [facility] [phone] (beeper) [phone] (Office)  08/17/2013, 3:44 PM

## 2013-08-25 ENCOUNTER — Encounter (INDEPENDENT_AMBULATORY_CARE_PROVIDER_SITE_OTHER): Payer: 59 | Admitting: Surgery

## 2013-08-28 ENCOUNTER — Ambulatory Visit (INDEPENDENT_AMBULATORY_CARE_PROVIDER_SITE_OTHER): Payer: 59 | Admitting: Surgery

## 2013-08-28 ENCOUNTER — Encounter (INDEPENDENT_AMBULATORY_CARE_PROVIDER_SITE_OTHER): Payer: Self-pay | Admitting: Surgery

## 2013-08-28 VITALS — BP 124/62 | HR 75 | Temp 98.0°F | Resp 18 | Ht 66.0 in | Wt 172.0 lb

## 2013-08-28 DIAGNOSIS — D0590 Unspecified type of carcinoma in situ of unspecified breast: Secondary | ICD-10-CM

## 2013-08-28 DIAGNOSIS — D0510 Intraductal carcinoma in situ of unspecified breast: Secondary | ICD-10-CM

## 2013-08-28 NOTE — Progress Notes (Signed)
S/p right mastectomy for DCIS (9.8 cm) and prophylactic left mastectomy on 07/15/13.  We have been dealing with subcutaneous seromas with several aspirations.  She has had some reaccumulation of seroma on the left.    We were able to aspirate only 60 cc of clear yellow fluid today from the left.  This is all that accumulated in the last 15 days.  Both incisions seem to be healing well with no signs of infection  She has met with Dr. Humphrey Rolls of Medical Oncology and they have determined that there is no indication for further adjuvant therapy.  She has discussed reconstruction with Dr. Dessie Coma.  He wanted to wait to schedule surgery until the seroma had resolved.  At this point, there are no signs of residual seroma on the right and minimal accumulation on the left.  I told the patient that she may contact Dr. Dessie Coma to discuss scheduling of her procedure.    We will be glad to see her back as needed, but will otherwise recheck her in 6 months.  Kristin Lewis. [name], MD, [facility] Surgery  General/ Trauma Surgery  08/28/2013 5:31 PM

## 2013-08-31 ENCOUNTER — Telehealth: Payer: Self-pay | Admitting: *Deleted

## 2013-08-31 NOTE — Telephone Encounter (Signed)
Faxed Care Plan to [facility] at [place] and to the PCP.  Took paperwork to Med Rec to scan.

## 2013-09-03 ENCOUNTER — Encounter (INDEPENDENT_AMBULATORY_CARE_PROVIDER_SITE_OTHER): Payer: 59 | Admitting: Surgery

## 2013-09-11 ENCOUNTER — Telehealth: Payer: Self-pay | Admitting: *Deleted

## 2013-09-11 NOTE — Telephone Encounter (Signed)
Left message for a return phone call.

## 2013-10-20 ENCOUNTER — Other Ambulatory Visit: Payer: Self-pay | Admitting: Plastic Surgery

## 2014-02-26 ENCOUNTER — Other Ambulatory Visit: Payer: Self-pay | Admitting: Family Medicine

## 2014-02-26 ENCOUNTER — Ambulatory Visit (INDEPENDENT_AMBULATORY_CARE_PROVIDER_SITE_OTHER): Payer: BC Managed Care – PPO | Admitting: Surgery

## 2014-02-26 ENCOUNTER — Ambulatory Visit
Admission: RE | Admit: 2014-02-26 | Discharge: 2014-02-26 | Disposition: A | Discharge status: Home / Self Care | Payer: BC Managed Care – PPO | Source: Ambulatory Visit | Attending: Family Medicine | Admitting: Family Medicine

## 2014-02-26 ENCOUNTER — Encounter (INDEPENDENT_AMBULATORY_CARE_PROVIDER_SITE_OTHER): Payer: Self-pay | Admitting: Surgery

## 2014-02-26 VITALS — BP 126/88 | HR 60 | Temp 98.2°F | Resp 16 | Ht 66.0 in | Wt 176.2 lb

## 2014-02-26 DIAGNOSIS — R05 Cough: Secondary | ICD-10-CM

## 2014-02-26 DIAGNOSIS — R059 Cough, unspecified: Secondary | ICD-10-CM

## 2014-02-26 DIAGNOSIS — D0590 Unspecified type of carcinoma in situ of unspecified breast: Secondary | ICD-10-CM

## 2014-02-26 DIAGNOSIS — D0511 Intraductal carcinoma in situ of right breast: Secondary | ICD-10-CM

## 2014-02-26 NOTE — Progress Notes (Signed)
Status post bilateral mastectomies with right sentinel lymph node biopsy on 07/15/13 for right breast DCIS. The patient underwent attempted reconstruction in March of 2015 by Dr. Dessie Coma. He had significant difficulties because of scarring. She is scheduled to have further surgery in the future to try to create more symmetry.  Otherwise she is doing quite well. No new masses on self-examination. She feels well.  Both incisions are well-healed with no sign of palpable nodules underneath the skin. No axillary lymphadenopathy on either side.  The patient is not undergoing any treatment at this time. She will followup with [place] in one year. She is scheduled to see oncology later this year.  Kristin Burn. [name], MD, [facility]  General/ Trauma Surgery  02/26/2014 9:36 AM

## 2014-05-01 ENCOUNTER — Telehealth: Payer: Self-pay | Admitting: Hematology and Oncology

## 2014-05-01 NOTE — Telephone Encounter (Signed)
Lft msg for pt regarding labs/ov and MD change, mailed sch........ KJ °

## 2014-07-26 ENCOUNTER — Ambulatory Visit
Admission: RE | Admit: 2014-07-26 | Discharge: 2014-07-26 | Disposition: A | Discharge status: Home / Self Care | Payer: BC Managed Care – PPO | Source: Ambulatory Visit | Attending: Family Medicine | Admitting: Family Medicine

## 2014-07-26 ENCOUNTER — Other Ambulatory Visit: Payer: Self-pay | Admitting: Family Medicine

## 2014-07-26 DIAGNOSIS — R1032 Left lower quadrant pain: Secondary | ICD-10-CM

## 2014-08-03 ENCOUNTER — Other Ambulatory Visit: Payer: Self-pay | Admitting: Family Medicine

## 2014-08-03 DIAGNOSIS — R102 Pelvic and perineal pain: Secondary | ICD-10-CM

## 2014-08-03 DIAGNOSIS — R1030 Lower abdominal pain, unspecified: Secondary | ICD-10-CM

## 2014-08-05 ENCOUNTER — Ambulatory Visit
Admission: RE | Admit: 2014-08-05 | Discharge: 2014-08-05 | Disposition: A | Discharge status: Home / Self Care | Payer: BC Managed Care – PPO | Source: Ambulatory Visit | Attending: Family Medicine | Admitting: Family Medicine

## 2014-08-05 DIAGNOSIS — R1030 Lower abdominal pain, unspecified: Secondary | ICD-10-CM

## 2014-08-05 DIAGNOSIS — R102 Pelvic and perineal pain: Secondary | ICD-10-CM

## 2014-08-18 ENCOUNTER — Other Ambulatory Visit: Payer: Self-pay

## 2014-08-18 DIAGNOSIS — D0510 Intraductal carcinoma in situ of unspecified breast: Secondary | ICD-10-CM

## 2014-08-18 DIAGNOSIS — D0511 Intraductal carcinoma in situ of right breast: Secondary | ICD-10-CM

## 2014-08-18 DIAGNOSIS — C50411 Malignant neoplasm of upper-outer quadrant of right female breast: Secondary | ICD-10-CM

## 2014-08-19 ENCOUNTER — Other Ambulatory Visit: Payer: 59

## 2014-08-19 ENCOUNTER — Telehealth: Payer: Self-pay | Admitting: Hematology

## 2014-08-19 ENCOUNTER — Ambulatory Visit: Payer: 59 | Admitting: Hematology and Oncology

## 2014-08-19 NOTE — Telephone Encounter (Signed)
LM to r/s appt due to being stuck in traffic.

## 2014-09-07 ENCOUNTER — Other Ambulatory Visit: Payer: Self-pay | Admitting: Plastic Surgery

## 2014-09-14 ENCOUNTER — Telehealth: Payer: Self-pay | Admitting: Hematology

## 2014-09-14 NOTE — Telephone Encounter (Signed)
Patient confirm appointment for 03/01. Patient didn't need a calendar mailed.

## 2014-10-05 ENCOUNTER — Encounter: Payer: Self-pay | Admitting: Hematology

## 2014-10-05 ENCOUNTER — Ambulatory Visit (HOSPITAL_BASED_OUTPATIENT_CLINIC_OR_DEPARTMENT_OTHER): Payer: BLUE CROSS/BLUE SHIELD | Admitting: Hematology

## 2014-10-05 ENCOUNTER — Other Ambulatory Visit (HOSPITAL_BASED_OUTPATIENT_CLINIC_OR_DEPARTMENT_OTHER): Payer: BLUE CROSS/BLUE SHIELD

## 2014-10-05 ENCOUNTER — Telehealth: Payer: Self-pay | Admitting: Hematology

## 2014-10-05 VITALS — BP 143/71 | HR 62 | Temp 98.6°F | Resp 18 | Ht 66.0 in | Wt 177.2 lb

## 2014-10-05 DIAGNOSIS — C50411 Malignant neoplasm of upper-outer quadrant of right female breast: Secondary | ICD-10-CM

## 2014-10-05 DIAGNOSIS — D0510 Intraductal carcinoma in situ of unspecified breast: Secondary | ICD-10-CM

## 2014-10-05 DIAGNOSIS — D0511 Intraductal carcinoma in situ of right breast: Secondary | ICD-10-CM

## 2014-10-05 DIAGNOSIS — Z170 Estrogen receptor positive status [ER+]: Secondary | ICD-10-CM

## 2014-10-05 LAB — CBC WITH DIFFERENTIAL/PLATELET
BASO%: 0.5 % (ref 0.0–2.0)
Basophils Absolute: 0 10*3/uL (ref 0.0–0.1)
EOS%: 4 % (ref 0.0–7.0)
Eosinophils Absolute: 0.3 10*3/uL (ref 0.0–0.5)
HCT: 39.2 % (ref 34.8–46.6)
HGB: 12.9 g/dL (ref 11.6–15.9)
LYMPH%: 30.4 % (ref 14.0–49.7)
MCH: 29.9 pg (ref 25.1–34.0)
MCHC: 32.9 g/dL (ref 31.5–36.0)
MCV: 91 fL (ref 79.5–101.0)
MONO#: 0.5 10*3/uL (ref 0.1–0.9)
MONO%: 7.1 % (ref 0.0–14.0)
NEUT#: 4.2 10*3/uL (ref 1.5–6.5)
NEUT%: 58 % (ref 38.4–76.8)
Platelets: 225 10*3/uL (ref 145–400)
RBC: 4.31 10*6/uL (ref 3.70–5.45)
RDW: 13.1 % (ref 11.2–14.5)
WBC: 7.3 10*3/uL (ref 3.9–10.3)
lymph#: 2.2 10*3/uL (ref 0.9–3.3)

## 2014-10-05 LAB — COMPREHENSIVE METABOLIC PANEL (CC13)
ALK PHOS: 86 U/L (ref 40–150)
ALT: 20 U/L (ref 0–55)
ANION GAP: 9 meq/L (ref 3–11)
AST: 19 U/L (ref 5–34)
Albumin: 3.7 g/dL (ref 3.5–5.0)
BUN: 19.8 mg/dL (ref 7.0–26.0)
CO2: 25 meq/L (ref 22–29)
CREATININE: 1 mg/dL (ref 0.6–1.1)
Calcium: 9.3 mg/dL (ref 8.4–10.4)
Chloride: 108 mEq/L (ref 98–109)
EGFR: 60 mL/min/{1.73_m2} — ABNORMAL LOW (ref >90)
GLUCOSE: 127 mg/dL (ref 70–140)
Potassium: 4 mEq/L (ref 3.5–5.1)
SODIUM: 142 meq/L (ref 136–145)
TOTAL PROTEIN: 6.9 g/dL (ref 6.4–8.3)
Total Bilirubin: 0.3 mg/dL (ref 0.20–1.20)

## 2014-10-05 NOTE — Telephone Encounter (Signed)
Pt confirmed MD visit per 03/01 POF, gave pt AVS... KJ

## 2014-10-05 NOTE — Progress Notes (Signed)
Kristin Lewis [id number] November 26, 1950 64 y.o. 10/05/2014 8:49 AM  CC  DEWEY,ELIZABETH, MD [address] [place] [address] Dr. 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 Gershon Crane  HISTORY OF PRESENT ILLNESS:  Kristin Lewis is a 64 y.o. female.  Underwent a routine screening mammogram that revealed multiple right breast calcifications. She had a biopsy performed that showed DCIS but MRI revealed an area of 9.8 x 7.7 x 5.9 cm diffuse enhancement.  Patient is status post bilateral mastectomy with. Right mastectomy with sentinel lymph node biopsy on 07/15/2013 with a left prophylactic mastectomy. The final pathology revealed on the right side DCIS with calcifications high-grade spanning 10.2 cm. 2 sentinel nodes were negative for metastatic disease. All surgical resection margins were negative for carcinoma. Left simple mastectomy revealed atypical lobular hyperplasia Sentinel nodes were negative. Postoperatively patient is doing well. She is now seen in medical oncology for discussion of treatment options. Of note DCIS was ER positive PR negative.  Kristin Lewis returns for follow up. She had second breast reconstruction 4 weeks ago. She had lots of issues (pain, discomfort) from her first reconstruction surgery one year ago. She has recovered well from the recent surgery, and feels much better overall. She otherwise denies any new symptoms, such as pain, chest discomfort, abdominal discomfort, or weight loss.  She had colonoscopy  in January 2016. He was negative per patient.  Past Medical History: Past Medical History  Diagnosis Date  . Depression   . GERD (gastroesophageal reflux disease)   . Breast cancer     "right only" (07/15/2013)    Past Surgical History: Past Surgical History  Procedure Laterality Date  . Tummy tuck    . Blepharoplasty Bilateral   . Tubal ligation    . Sentinel lymph node biopsy Right 07/15/2013  . Mastectomy Bilateral 07/15/2013    prophylactic left mastectomy/notes 06/23/2013  . Breast biopsy  06/17/2013    sterotactic/notes 06/23/2013  . Mastectomy w/ sentinel node biopsy Bilateral 07/15/2013    Procedure: BILATERAL MASTECTOMY WITH  RIGHT SENTINEL LYMPH NODE BIOPSY;  Surgeon: Imogene Burn. [name], MD;  Location: [place];  Service: General;  Laterality: Bilateral;  RIGHT NUC MED INJECTION 10:30  . Breast reconstruction      Family History: No family history on file.  Social History History  Substance Use Topics  . Smoking status: Never Smoker   . Smokeless tobacco: Never Used  . Alcohol Use: 0.6 oz/week    1 Shots of liquor per week     Comment: less than 1 per week    Allergies: Not on File  Current Medications: Current Outpatient Prescriptions  Medication Sig Dispense Refill  . acetaminophen (TYLENOL) 500 MG tablet Take 500 mg by mouth every 4 (four) hours as needed.    . sertraline (ZOLOFT) 100 MG tablet Take 100 mg by mouth daily.     No current facility-administered medications for this visit.    OB/GYN History: menarche at 67, menopause at  82, no HRT, nulliparous,   Fertility Discussion: no Prior History of Cancer: skin basal cell  Health Maintenance:  Colonoscopy no Bone Density yes in 10 years Last PAP smear 2014  ECOG PERFORMANCE STATUS: 0 - Asymptomatic  Genetic Counseling/testing: no  REVIEW OF SYSTEMS:  14 point review of systems was taken and a rescan separately into the electronic medical record  PHYSICAL EXAMINATION: Blood pressure  143/71, pulse 62, temperature 98.6 F (37 C), temperature source Oral, resp. rate 18, height 5\' 6"  (1.676 m), weight 177 lb 3.2 oz (80.377 kg).  DUK:GURKY, no distress, well nourished and well developed SKIN: skin color, texture, turgor are normal, no rashes or significant lesions HEAD: Normocephalic EYES: PERRLA, EOMI, Conjunctiva are pink and non-injected EARS: External ears normal OROPHARYNX:no exudate, no erythema and lips, buccal mucosa, and tongue normal  NECK: supple, no adenopathy LYMPH:  no palpable lymphadenopathy BREAST: Bilateral mastectomy with reconstruction, surgical site well healed, no palpable mass.  LUNGS: clear to auscultation  HEART: regular rate & rhythm ABDOMEN:abdomen soft, non-tender, normal bowel sounds and no masses or organomegaly BACK: Back symmetric, no curvature. EXTREMITIES:no edema, no clubbing, no cyanosis  NEURO: alert & oriented x 3 with fluent speech, no focal motor/sensory deficits, gait normal   STUDIES/RESULTS: No results found.   LABS:    Chemistry      Component Value Date/Time   NA 140 08/17/2013 1511   NA 134* 07/16/2013 0405   K 3.9 08/17/2013 1511   K 3.8 07/16/2013 0405   CL 103 07/16/2013 0405   CO2 20* 08/17/2013 1511   CO2 23 07/16/2013 0405   BUN 20.2 08/17/2013 1511   BUN 15 07/16/2013 0405   CREATININE 1.3* 08/17/2013 1511   CREATININE 0.86 07/16/2013 0405      Component Value Date/Time   CALCIUM 9.2 08/17/2013 1511   CALCIUM 7.9* 07/16/2013 0405   ALKPHOS 100 08/17/2013 1511   ALKPHOS 88 07/10/2013 1256   AST 22 08/17/2013 1511   AST 21 07/10/2013 1256   ALT 28 08/17/2013 1511   ALT 23 07/10/2013 1256   BILITOT 0.26 08/17/2013 1511   BILITOT 0.2* 07/10/2013 1256      Lab Results  Component Value Date   WBC 7.3 10/05/2014   HGB 12.9 10/05/2014   HCT 39.2 10/05/2014   MCV 91.0 10/05/2014   PLT 225 10/05/2014   Diagnosis 1. Breast, simple mastectomy, Left - LOBULAR NEOPLASIA (ATYPICAL LOBULAR  HYPERPLASIA), WITH CALCIFICATIONS. - FIBROCYSTIC CHANGES WITH CALCIFICATIONS. 2. Lymph node, biopsy, Left axillary - THERE IS NO EVIDENCE OF CARCINOMA IN 1 OF 1 LYMPH NODE (0/1). 3. Lymph node, sentinel, biopsy, Right axillary #1 - THERE IS NO EVIDENCE OF CARCINOMA IN 1 OF 1 LYMPH NODE (0/1). 4. Lymph node, sentinel, biopsy, Right axillary #2 - THERE IS NO EVIDENCE OF CARCINOMA IN 1 OF 1 LYMPH NODE (0/1). 5. Breast, simple mastectomy, Right - DUCTAL CARCINOMA IN SITU WITH CALCIFICATIONS, HIGH GRADE, SPANNING 10.2 CM. - THERE IS NO EVIDENCE OF CARCINOMA IN 2 OF 2 LYMPH NODES (0/2). - THE SURGICAL RESECTION MARGINS ARE NEGATIVE FOR CARCINOMA. - SEE ONCOLOGY TABLE BELOW. Microscopic Comment 5. BREAST, IN SITU CARCINOMA Specimen, including laterality: Right breast. Procedure (include lymph node sampling sentinel-non-sentinel: Simple mastectomy and node dissection. Grade of carcinoma: High grade. Necrosis: Present. Estimated tumor size: (gross measurement): 10.2 cm Treatment effect: N/A Distance to closest margin: 1.6 cm to the anterior soft tissue margin (gross measurement). Breast prognostic profile: [phone] Estrogen receptor: 100%, moderate staining intensity. Progesterone  receptor: 0%. Lymph nodes: Examined: 2 Sentinel 1 of 3 Duplicate copy FINAL for AUDYN, DIMERCURIO [phone]) Microscopic Comment(continued) 2 Non-sentinel 4 Total Lymph nodes with metastasis: 0 TNM: pTis, pN0 (JBK:gt, 07/17/13) JOSHUA  ASSESSMENT AND PLAN   64 year old female with a screen detected ductal carcinoma in situ of the right breast.   1. Right DCIS, high grade, ER+/PR+  -She is clinically doing well, exam unremarkable, no evidence of recurrence. -She has had bilateral mastectomy, no need for routine screening mammogram  -We discussed that her breast cancer is cured, however there is still very low (<5%) risk of breast cancer in patients with bilateral mastectomy, so I will do recommend  continue surveillance with physical examination. -No need for routine lab test, which is not sensitive for detecting breast cancer. -We discussed healthy lifestyle, such as healthy diet and regular exercise, to reduce risk of breast cancer and improve overall well-being. -I encouraged her to take calcium and vitamin D for her bone health. -I'll see her back in 1 year for physical exam.  All questions were answered. The patient knows to call the clinic with any problems, questions or concerns. We can certainly see the patient much sooner if necessary.  I spent 20 minutes counseling the patient face to face. The total time spent in the appointment was 25 minutes.  Truitt Merle  10/05/2014

## 2015-10-05 ENCOUNTER — Telehealth: Payer: Self-pay | Admitting: Hematology

## 2015-10-05 NOTE — Telephone Encounter (Signed)
Left message in reference to resch. appt time and date and left callback number

## 2015-10-05 NOTE — Telephone Encounter (Signed)
Due to YF out moved 3/2 fu to 3/30 @ 2:30 pm. left message for patient re change.

## 2015-10-06 ENCOUNTER — Ambulatory Visit: Payer: BLUE CROSS/BLUE SHIELD | Admitting: Hematology

## 2015-11-03 ENCOUNTER — Ambulatory Visit (HOSPITAL_BASED_OUTPATIENT_CLINIC_OR_DEPARTMENT_OTHER): Payer: BLUE CROSS/BLUE SHIELD | Admitting: Hematology

## 2015-11-03 ENCOUNTER — Telehealth: Payer: Self-pay | Admitting: Hematology

## 2015-11-03 ENCOUNTER — Encounter: Payer: Self-pay | Admitting: Hematology

## 2015-11-03 VITALS — BP 141/79 | HR 59 | Temp 97.8°F | Resp 18 | Ht 66.0 in | Wt 186.0 lb

## 2015-11-03 DIAGNOSIS — Z853 Personal history of malignant neoplasm of breast: Secondary | ICD-10-CM | POA: Diagnosis not present

## 2015-11-03 DIAGNOSIS — D0511 Intraductal carcinoma in situ of right breast: Secondary | ICD-10-CM

## 2015-11-03 NOTE — Telephone Encounter (Signed)
Gave and printed appt shced and avs for pt fr March 2018

## 2015-11-03 NOTE — Progress Notes (Signed)
Kristin Lewis [id number] April 16, 1951 65 y.o. 11/03/2015 8:22 AM  CC  Rachell Cipro, MD [address] [place] [address] Dr. 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 Gershon Crane  HISTORY OF PRESENT ILLNESS:  Kristin Lewis is a 65 y.o. female.  Underwent a routine screening mammogram that revealed multiple right breast calcifications. She had a biopsy performed that showed DCIS but MRI revealed an area of 9.8 x 7.7 x 5.9 cm diffuse enhancement.  Patient is status post bilateral mastectomy with. Right mastectomy with sentinel lymph node biopsy on 07/15/2013 with a left prophylactic mastectomy. The final pathology revealed on the right side DCIS with calcifications high-grade spanning 10.2 cm. 2 sentinel nodes were negative for metastatic disease. All surgical resection margins were negative for carcinoma. Left simple mastectomy revealed atypical lobular hyperplasia Sentinel nodes were negative. Postoperatively patient is doing well. She is now seen in medical oncology for discussion of treatment options. Of note DCIS was ER positive PR negative.  [name] returns for follow up. She had second breast reconstruction 4 weeks ago. She had lots of issues (pain, discomfort) from her first reconstruction surgery one year ago. She has recovered well from the recent surgery, and feels much better overall. She otherwise denies any new symptoms, such as pain, chest discomfort, abdominal discomfort, or weight loss.  She had colonoscopy  in January 2016. He was negative per patient.  INTERIM HISTORY: Kristin Lewis returns for follow up, she had two breast reconstructions with implants, but she was not very happy with the outcome due to the asymmetry. She went to see another Psychiatric nurse at [facility], and is considering to have another recurrence and surgery later this year. She otherwise doing very well, denies any pain, cough, dyspnea, or other symptoms. She sees her primary care physician and her annual checkup was normal. She plan to do more exercise, and is trying to lose some weight.  Past Medical History: Past Medical History  Diagnosis Date  . Depression   . GERD (gastroesophageal reflux disease)   . Breast cancer [facility])     "right only" (07/15/2013)    Past Surgical History: Past Surgical History  Procedure Laterality Date  . Tummy tuck    . Blepharoplasty Bilateral   . Tubal ligation    . Sentinel lymph node biopsy Right 07/15/2013  . Mastectomy Bilateral 07/15/2013    prophylactic left mastectomy/notes 06/23/2013  . Breast biopsy  06/17/2013    sterotactic/notes 06/23/2013  . Mastectomy w/ sentinel node biopsy Bilateral 07/15/2013    Procedure: BILATERAL MASTECTOMY WITH  RIGHT SENTINEL LYMPH NODE BIOPSY;  Surgeon: Imogene Burn. [name], MD;  Location: [place];  Service: General;  Laterality: Bilateral;  RIGHT NUC MED INJECTION 10:30  . Breast reconstruction      Family History: History reviewed. No pertinent family history.  Social History Social History  Substance Use Topics  . Smoking status: Never Smoker   . Smokeless tobacco: Never Used  . Alcohol Use: 0.6 oz/week  1 Shots of liquor per week     Comment: less than 1 per week    Allergies: Not on File  Current Medications: Current Outpatient Prescriptions  Medication Sig Dispense Refill  . acetaminophen (TYLENOL) 500 MG tablet Take 500 mg by mouth every 4 (four) hours as needed.    . sertraline (ZOLOFT) 100 MG tablet Take 100 mg by mouth daily.      No current facility-administered medications for this visit.    OB/GYN History: menarche at 64, menopause at 51, no HRT, nulliparous,   Fertility Discussion: no Prior History of Cancer: skin basal cell  Health Maintenance:  Colonoscopy no Bone Density yes in 10 years Last PAP smear 2014  ECOG PERFORMANCE STATUS: 0 - Asymptomatic  Genetic Counseling/testing: no  REVIEW OF SYSTEMS:  14 point review of systems was taken and a rescan separately into the electronic medical record  PHYSICAL EXAMINATION: There were no vitals taken for this visit.  [id number], no distress, well nourished and well developed SKIN: skin color, texture, turgor are normal, no rashes or significant lesions HEAD: Normocephalic EYES: PERRLA, EOMI, Conjunctiva are pink and non-injected EARS: External ears normal OROPHARYNX:no exudate, no erythema and lips, buccal mucosa, and tongue normal  NECK: supple, no adenopathy LYMPH:  no palpable lymphadenopathy BREAST: Bilateral mastectomy with reconstruction, surgical site well healed, no significant scar tissue or tenderness, no palpable mass in both right breasts and axilla.  LUNGS: clear to auscultation  HEART: regular rate & rhythm ABDOMEN:abdomen soft, non-tender, normal bowel sounds and no masses or organomegaly BACK: Back symmetric, no curvature. EXTREMITIES:no edema, no clubbing, no cyanosis  NEURO: alert & oriented x 3 with fluent speech, no focal motor/sensory deficits, gait normal   STUDIES/RESULTS: No results found.   LABS:    Chemistry      Component Value Date/Time   NA 142 10/05/2014 0827   NA 134* 07/16/2013 0405   K 4.0 10/05/2014 0827   K 3.8 07/16/2013 0405   CL 103 07/16/2013 0405   CO2 25 10/05/2014 0827   CO2 23 07/16/2013 0405   BUN 19.8 10/05/2014 0827   BUN 15 07/16/2013 0405   CREATININE 1.0 10/05/2014 0827   CREATININE 0.86 07/16/2013 0405      Component Value Date/Time   CALCIUM 9.3 10/05/2014 0827   CALCIUM 7.9*  07/16/2013 0405   ALKPHOS 86 10/05/2014 0827   ALKPHOS 88 07/10/2013 1256   AST 19 10/05/2014 0827   AST 21 07/10/2013 1256   ALT 20 10/05/2014 0827   ALT 23 07/10/2013 1256   BILITOT 0.30 10/05/2014 0827   BILITOT 0.2* 07/10/2013 1256      Lab Results  Component Value Date   WBC 7.3 10/05/2014   HGB 12.9 10/05/2014   HCT 39.2 10/05/2014   MCV 91.0 10/05/2014   PLT 225 10/05/2014   Diagnosis 1. Breast, simple mastectomy, Left - LOBULAR NEOPLASIA (ATYPICAL LOBULAR HYPERPLASIA), WITH CALCIFICATIONS. - FIBROCYSTIC CHANGES WITH CALCIFICATIONS. 2. Lymph node, biopsy, Left axillary - THERE IS NO EVIDENCE OF CARCINOMA IN 1 OF 1 LYMPH NODE (0/1). 3. Lymph node, sentinel, biopsy, Right axillary #1 - THERE IS NO EVIDENCE OF CARCINOMA IN 1 OF 1 LYMPH NODE (0/1). 4. Lymph node, sentinel, biopsy, Right axillary #2 - THERE IS NO EVIDENCE OF CARCINOMA IN 1 OF 1 LYMPH NODE (0/1). 5. Breast, simple mastectomy, Right - DUCTAL CARCINOMA IN SITU WITH CALCIFICATIONS, HIGH GRADE, SPANNING 10.2 CM. - THERE IS NO EVIDENCE OF CARCINOMA IN 2 OF 2 LYMPH NODES (  0/2). - THE SURGICAL RESECTION MARGINS ARE NEGATIVE FOR CARCINOMA. - SEE ONCOLOGY TABLE BELOW. Microscopic Comment 5. BREAST, IN SITU CARCINOMA Specimen, including laterality: Right breast. Procedure (include lymph node sampling sentinel-non-sentinel: Simple mastectomy and node dissection. Grade of carcinoma: High grade. Necrosis: Present. Estimated tumor size: (gross measurement): 10.2 cm Treatment effect: N/A Distance to closest margin: 1.6 cm to the anterior soft tissue margin (gross measurement). Breast prognostic profile: [id number] Estrogen receptor: 100%, moderate staining intensity. Progesterone receptor: 0%. Lymph nodes: Examined: 2 Sentinel 1 of 3 Duplicate copy FINAL for Kristin, Lewis [phone]) Microscopic Comment(continued) 2 Non-sentinel 4 Total Lymph nodes with metastasis: 0 TNM: pTis, pN0 (JBK:gt,  07/17/13) JOSHUA  ASSESSMENT AND PLAN   65 year old female with a screen detected ductal carcinoma in situ of the right breast.   1. Right DCIS, high grade, ER+/PR+  -She is clinically doing well, exam unremarkable, no evidence of recurrence. -She has had bilateral mastectomy, no need for routine screening mammogram  -We discussed that her breast cancer is cured, however there is still very low (<5%) risk of breast cancer in patients with bilateral mastectomy, so I will do recommend continue surveillance with physical examination. -No need for routine lab test, which is not sensitive for detecting breast cancer. -I encouraged her to continue healthy lifestyle, such as healthy diet and regular exercise, to reduce risk of breast cancer and improve overall well-being. -I encouraged her to take calcium and vitamin D for her bone health. -She plans to have a third breast reconstruction surgery at [facility] later this year. -she prefers to follow up with [place] for annual checkup. recommend her to be seen at our breast survivorship clinic next year. She agrees  All questions were answered. The patient knows to call the clinic with any problems, questions or concerns. We can certainly see the patient much sooner if necessary.  I spent 10 minutes counseling the patient face to face. The total time spent in the appointment was 15 minutes.  Truitt Merle  11/03/2015

## 2016-08-08 DIAGNOSIS — R1032 Left lower quadrant pain: Secondary | ICD-10-CM | POA: Diagnosis not present

## 2016-08-08 DIAGNOSIS — Z1211 Encounter for screening for malignant neoplasm of colon: Secondary | ICD-10-CM | POA: Diagnosis not present

## 2016-08-08 DIAGNOSIS — Z6828 Body mass index (BMI) 28.0-28.9, adult: Secondary | ICD-10-CM | POA: Diagnosis not present

## 2016-08-08 DIAGNOSIS — R109 Unspecified abdominal pain: Secondary | ICD-10-CM | POA: Diagnosis not present

## 2016-08-08 DIAGNOSIS — M19019 Primary osteoarthritis, unspecified shoulder: Secondary | ICD-10-CM | POA: Diagnosis not present

## 2016-08-20 ENCOUNTER — Encounter: Payer: Self-pay | Admitting: Gastroenterology

## 2016-08-20 ENCOUNTER — Ambulatory Visit (INDEPENDENT_AMBULATORY_CARE_PROVIDER_SITE_OTHER): Payer: Medicare Other | Admitting: Gastroenterology

## 2016-08-20 VITALS — BP 110/84 | HR 72 | Ht 66.0 in | Wt 175.2 lb

## 2016-08-20 DIAGNOSIS — K5900 Constipation, unspecified: Secondary | ICD-10-CM | POA: Diagnosis not present

## 2016-08-20 DIAGNOSIS — K625 Hemorrhage of anus and rectum: Secondary | ICD-10-CM

## 2016-08-20 DIAGNOSIS — R1030 Lower abdominal pain, unspecified: Secondary | ICD-10-CM

## 2016-08-20 DIAGNOSIS — Z1211 Encounter for screening for malignant neoplasm of colon: Secondary | ICD-10-CM | POA: Diagnosis not present

## 2016-08-20 MED ORDER — NA SULFATE-K SULFATE-MG SULF 17.5-3.13-1.6 GM/177ML PO SOLN: 1.0000 | Freq: Once | ORAL | 0 refills | Status: AC

## 2016-08-20 NOTE — Progress Notes (Signed)
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 She was having increased bowel frequency and diarrhea since January of last year and that has improved in September after she quit her previous job. She thought diarrhea was related to stress at work. She is currently having difficulty with evacuation and feeling constipated she has 3-4 bowel movements a week. Sometimes she has to strain excessively.  she also noticed bright red blood per rectum on few occasions when she wiped and also in the toilet bowl. Denies any weight loss. No family history of IBD or colon cancer. She has not had a screening colonoscopy within the past 10 years per patient. No records available, reviewed her chart on care everywhere in epic.     Outpatient Encounter Prescriptions as of 08/20/2016  Medication Sig  . cyanocobalamin 2000 MCG tablet Take 2,500 mcg by mouth daily.  Marland Kitchen omeprazole (PRILOSEC) 40 MG capsule Take 40 mg by mouth daily.  . sertraline (ZOLOFT) 100 MG tablet Take 100 mg by mouth daily.  . [DISCONTINUED] acetaminophen (TYLENOL) 500 MG tablet Take 500 mg by mouth every 4 (four) hours as needed. Reported on 11/03/2015   No facility-administered encounter medications on file as of 08/20/2016.     Allergies as of 08/20/2016  . (No Known Allergies)    Past Medical History:  Diagnosis Date  . Breast cancer ([facility])    "right only" (07/15/2013)  . Depression   . GERD (gastroesophageal reflux disease)     Past Surgical History:  Procedure Laterality Date  . BLEPHAROPLASTY Bilateral   . BREAST BIOPSY  06/17/2013   sterotactic/notes 06/23/2013  . BREAST RECONSTRUCTION    . MASTECTOMY Bilateral 07/15/2013   prophylactic left mastectomy/notes 06/23/2013  . MASTECTOMY W/ SENTINEL NODE BIOPSY Bilateral 07/15/2013   Procedure: BILATERAL MASTECTOMY WITH  RIGHT SENTINEL LYMPH NODE BIOPSY;  Surgeon: Imogene Burn. [name], MD;  Location: [facility];  Service: General;  Laterality: Bilateral;  RIGHT NUC MED INJECTION 10:30  . SENTINEL LYMPH NODE BIOPSY Right 07/15/2013  . TUBAL LIGATION    . tummy tuck      Family History  Problem Relation Age of Onset  . Colon cancer Neg Hx   . Stomach cancer Neg Hx   . Rectal cancer Neg Hx   . Esophageal cancer Neg Hx   . Liver cancer Neg Hx     Social History   Social History  . Marital status: Single    Spouse name: N/A  . Number of children: 0  . Years of education: N/A   Occupational History  . [employer]    Social History Main Topics  . Smoking status: Never Smoker  . Smokeless tobacco: Never Used  . Alcohol use 0.6 oz/week    1 Shots of liquor per week     Comment: less than 1 per month  . Drug use: No  . Sexual activity: Not Currently    Birth control/ protection: Post-menopausal   Other Topics Concern  . Not on file   Social History Narrative  . No narrative on file  Review of systems: Review of Systems  Constitutional: Negative for fever and chills.  HENT: Negative.   Eyes: Negative for blurred vision.  Respiratory: Negative for cough, shortness of breath and wheezing.   Cardiovascular: Negative for chest pain and palpitations.  Gastrointestinal: as per HPI Genitourinary: Negative for dysuria, urgency, frequency and hematuria.  Musculoskeletal: Negative for myalgias, back pain and positive for shoulder joint pain.  Skin: Negative for itching and rash.  Neurological: Negative for dizziness, tremors, focal weakness, seizures and loss of consciousness.  Endo/Heme/Allergies: Positive for seasonal allergies.  Psychiatric/Behavioral:  Negative for depression, suicidal ideas and hallucinations.  All other systems reviewed and are negative.   Physical Exam: Vitals:   08/20/16 1445  BP: 110/84  Pulse: 72   Body mass index is 28.29 kg/m. Gen:      No acute distress HEENT:  EOMI, sclera anicteric Neck:     No masses; no thyromegaly Lungs:    Clear to auscultation bilaterally; normal respiratory effort CV:         Regular rate and rhythm; no murmurs Abd:      + bowel sounds; soft, non-tender; no palpable masses, no distension Ext:    No edema; adequate peripheral perfusion Skin:      Warm and dry; no rash Neuro: alert and oriented x 3 Psych: normal mood and affect  Data Reviewed:  Reviewed labs, radiology imaging, old records and pertinent past GI work up   Assessment and Plan/Recommendations: 66 year old female with history of breast cancer status post bilateral mastectomy here with complaints of change in bowel habits and intermittent small-volume bright red blood per rectum   We will schedule for colonoscopy for evaluation and colorectal cancer screening The risks and benefits as well as alternatives of endoscopic procedure(s) have been discussed and reviewed. All questions answered. The patient agrees to proceed.  Bright red blood per rectum: Small-volume likely secondary to bleeding internal hemorrhoids but we'll need to exclude other etiology Will consider hemorrhoidal band ligation after colonoscopy continues to have persistent symptoms  Constipation: Advise patient to increase dietary fiber and fluid intake Start soluble fiber [Benefiber] 1 tablespoon 3 times daily with meals  Lower abdominal pain with cramping: secondary to chronic constipation If continues to have persistent symptoms despite improvement of constipation we'll consider imaging for further evaluation after colonoscopy  Greater than 50% of the time used for counseling as well as treatment plan and follow-up. She had multiple questions  which were answered to her satisfaction  K. Denzil Magnuson , MD [phone] Mon-Fri 8a-5p [phone] after 5p, weekends, holidays  CC: Fanny Bien, MD

## 2016-08-20 NOTE — Patient Instructions (Signed)
You have been scheduled for a colonoscopy. Please follow written instructions given to you at your visit today.  Please pick up your prep supplies at the pharmacy within the next 1-3 days. If you use inhalers (even only as needed), please bring them with you on the day of your procedure.   Increase fluid intake  Use Benefiber /Soluable fiber 1 tablespoon three times a day  Hemorrhoidal banding after colonoscopy if needed

## 2016-08-23 ENCOUNTER — Encounter: Payer: Medicare Other | Admitting: Gastroenterology

## 2016-09-06 ENCOUNTER — Encounter: Payer: Self-pay | Admitting: Gastroenterology

## 2016-09-06 ENCOUNTER — Ambulatory Visit (AMBULATORY_SURGERY_CENTER): Payer: Medicare Other | Admitting: Gastroenterology

## 2016-09-06 VITALS — BP 113/77 | HR 50 | Temp 98.0°F | Resp 10 | Ht 66.0 in | Wt 175.0 lb

## 2016-09-06 DIAGNOSIS — K625 Hemorrhage of anus and rectum: Secondary | ICD-10-CM

## 2016-09-06 DIAGNOSIS — Z1212 Encounter for screening for malignant neoplasm of rectum: Secondary | ICD-10-CM | POA: Diagnosis not present

## 2016-09-06 DIAGNOSIS — E669 Obesity, unspecified: Secondary | ICD-10-CM | POA: Diagnosis not present

## 2016-09-06 DIAGNOSIS — Z1211 Encounter for screening for malignant neoplasm of colon: Secondary | ICD-10-CM

## 2016-09-06 DIAGNOSIS — K219 Gastro-esophageal reflux disease without esophagitis: Secondary | ICD-10-CM | POA: Diagnosis not present

## 2016-09-06 MED ORDER — SODIUM CHLORIDE 0.9 % IV SOLN: 500.0000 mL | INTRAVENOUS | Status: AC

## 2016-09-06 NOTE — Op Note (Signed)
[facility] Patient Name: Kristin Lewis Procedure Date: 09/06/2016 8:03 AM MRN: [id number] Endoscopist: Mauri Pole , MD Age: 66 Referring MD:  Date of Birth: [birth year] Gender: Female Account #: [id number] Procedure:                Colonoscopy Indications:              Screening for colorectal malignant neoplasm Medicines:                Monitored Anesthesia Care Procedure:                Pre-Anesthesia Assessment:                           - Prior to the procedure, a History and Physical                            was performed, and patient medications and                            allergies were reviewed. The patient's tolerance of                            previous anesthesia was also reviewed. The risks                            and benefits of the procedure and the sedation                            options and risks were discussed with the patient.                            All questions were answered, and informed consent                            was obtained. Prior Anticoagulants: The patient has                            taken no previous anticoagulant or antiplatelet                            agents. ASA Grade Assessment: II - A patient with                            mild systemic disease. After reviewing the risks                            and benefits, the patient was deemed in                            satisfactory condition to undergo the procedure.                           After obtaining informed consent, the colonoscope  was passed under direct vision. Throughout the                            procedure, the patient's blood pressure, pulse, and                            oxygen saturations were monitored continuously. The                            Model PCF-H190L [id number]) scope was introduced                            through the anus and advanced to the the cecum,                            identified by  appendiceal orifice and ileocecal                            valve. The colonoscopy was performed without                            difficulty. The patient tolerated the procedure                            well. The quality of the bowel preparation was                            excellent. The terminal ileum, ileocecal valve,                            appendiceal orifice, and rectum and the ileocecal                            valve, appendiceal orifice, and rectum were                            photographed. Scope In: 8:14:39 AM Scope Out: 8:35:17 AM Scope Withdrawal Time: 0 hours 12 minutes 37 seconds  Total Procedure Duration: 0 hours 20 minutes 38 seconds  Findings:                 The perianal and digital rectal examinations were                            normal.                           Non-bleeding internal hemorrhoids were found during                            retroflexion. The hemorrhoids were medium-sized.                           The exam was otherwise without abnormality. Complications:            No immediate complications. Estimated Blood Loss:  Estimated blood loss: none. Impression:               - Non-bleeding internal hemorrhoids.                           - The examination was otherwise normal.                           - No specimens collected. Recommendation:           - Patient has a contact number available for                            emergencies. The signs and symptoms of potential                            delayed complications were discussed with the                            patient. Return to normal activities tomorrow.                            Written discharge instructions were provided to the                            patient.                           - Resume previous diet.                           - Continue present medications.                           - Repeat colonoscopy in 10 years for screening                             purposes.                           - Return to GI office for hemorrhoidal band                            ligation if continues to have symptoms secondary to                            hemorrhoids. Mauri Pole, MD 09/06/2016 8:42:20 AM This report has been signed electronically.

## 2016-09-06 NOTE — Progress Notes (Signed)
Kristin Lewis called with 1st hemorrhoidal banding appointment date and time set up for pt. This was given to pt .prior to d/c.

## 2016-09-06 NOTE — Patient Instructions (Signed)
YOU HAD AN ENDOSCOPIC PROCEDURE TODAY AT [facility]:   Refer to the procedure report that was given to you for any specific questions about what was found during the examination.  If the procedure report does not answer your questions, please call your gastroenterologist to clarify.  If you requested that your care partner not be given the details of your procedure findings, then the procedure report has been included in a sealed envelope for you to review at your convenience later.  YOU SHOULD EXPECT: Some feelings of bloating in the abdomen. Passage of more gas than usual.  Walking can help get rid of the air that was put into your GI tract during the procedure and reduce the bloating. If you had a lower endoscopy (such as a colonoscopy or flexible sigmoidoscopy) you may notice spotting of blood in your stool or on the toilet paper. If you underwent a bowel prep for your procedure, you may not have a normal bowel movement for a few days.  Please Note:  You might notice some irritation and congestion in your nose or some drainage.  This is from the oxygen used during your procedure.  There is no need for concern and it should clear up in a day or so.  SYMPTOMS TO REPORT IMMEDIATELY:   Following lower endoscopy (colonoscopy or flexible sigmoidoscopy):  Excessive amounts of blood in the stool  Significant tenderness or worsening of abdominal pains  Swelling of the abdomen that is new, acute  Fever of 100F or higher    For urgent or emergent issues, a gastroenterologist can be reached at any hour by calling [phone].   DIET:  We do recommend a small meal at first, but then you may proceed to your regular diet.  Drink plenty of fluids but you should avoid alcoholic beverages for 24 hours.  ACTIVITY:  You should plan to take it easy for the rest of today and you should NOT DRIVE or use heavy machinery until tomorrow (because of the sedation medicines used during the test).     FOLLOW UP: Our staff will call the number listed on your records the next business day following your procedure to check on you and address any questions or concerns that you may have regarding the information given to you following your procedure. If we do not reach you, we will leave a message.  However, if you are feeling well and you are not experiencing any problems, there is no need to return our call.  We will assume that you have returned to your regular daily activities without incident.  If any biopsies were taken you will be contacted by phone or by letter within the next 1-3 weeks.  Please call us at [phone] if you have not heard about the biopsies in 3 weeks.    SIGNATURES/CONFIDENTIALITY: You and/or your care partner have signed paperwork which will be entered into your electronic medical record.  These signatures attest to the fact that that the information above on your After Visit Summary has been reviewed and is understood.  Full responsibility of the confidentiality of this discharge information lies with you and/or your care-partner.   Information on hemorrhoids and hemorrhoidal banding given to you today.

## 2016-09-06 NOTE — Progress Notes (Signed)
Report to PACU, RN, vss, BBS= Clear.  

## 2016-09-07 ENCOUNTER — Telehealth: Payer: Self-pay

## 2016-09-07 NOTE — Telephone Encounter (Signed)
  Follow up Call-  Call back number 09/06/2016  Post procedure Call Back phone  # [phone]  Permission to leave phone message Yes  Some recent data might be hidden     Patient questions:  Do you have a fever, pain , or abdominal swelling? No. Pain Score  0 *  Have you tolerated food without any problems? Yes.    Have you been able to return to your normal activities? Yes.    Do you have any questions about your discharge instructions: Diet   No. Medications  No. Follow up visit  No.  Do you have questions or concerns about your Care? No.  Actions: * If pain score is 4 or above: No action needed, pain <4.

## 2016-09-12 DIAGNOSIS — Z6826 Body mass index (BMI) 26.0-26.9, adult: Secondary | ICD-10-CM | POA: Diagnosis not present

## 2016-09-12 DIAGNOSIS — R1032 Left lower quadrant pain: Secondary | ICD-10-CM | POA: Diagnosis not present

## 2016-09-12 DIAGNOSIS — M19019 Primary osteoarthritis, unspecified shoulder: Secondary | ICD-10-CM | POA: Diagnosis not present

## 2016-09-12 DIAGNOSIS — K648 Other hemorrhoids: Secondary | ICD-10-CM | POA: Diagnosis not present

## 2016-10-01 ENCOUNTER — Encounter: Payer: Self-pay | Admitting: Gastroenterology

## 2016-10-01 ENCOUNTER — Encounter (INDEPENDENT_AMBULATORY_CARE_PROVIDER_SITE_OTHER): Payer: Self-pay

## 2016-10-01 ENCOUNTER — Ambulatory Visit (INDEPENDENT_AMBULATORY_CARE_PROVIDER_SITE_OTHER): Payer: Medicare Other | Admitting: Gastroenterology

## 2016-10-01 DIAGNOSIS — K641 Second degree hemorrhoids: Secondary | ICD-10-CM | POA: Diagnosis not present

## 2016-10-01 NOTE — Patient Instructions (Signed)
HEMORRHOID BANDING PROCEDURE    FOLLOW-UP CARE   1. The procedure you have had should have been relatively painless since the banding of the area involved does not have nerve endings and there is no pain sensation.  The rubber band cuts off the blood supply to the hemorrhoid and the band may fall off as soon as 48 hours after the banding (the band may occasionally be seen in the toilet bowl following a bowel movement). You may notice a temporary feeling of fullness in the rectum which should respond adequately to plain Tylenol or Motrin.  2. Following the banding, avoid strenuous exercise that evening and resume full activity the next day.  A sitz bath (soaking in a warm tub) or bidet is soothing, and can be useful for cleansing the area after bowel movements.     3. To avoid constipation, take two tablespoons of natural wheat bran, natural oat bran, flax, Benefiber or any over the counter fiber supplement and increase your water intake to 7-8 glasses daily.    4. Unless you have been prescribed anorectal medication, do not put anything inside your rectum for two weeks: No suppositories, enemas, fingers, etc.  5. Occasionally, you may have more bleeding than usual after the banding procedure.  This is often from the untreated hemorrhoids rather than the treated one.  Don't be concerned if there is a tablespoon or so of blood.  If there is more blood than this, lie flat with your bottom higher than your head and apply an ice pack to the area. If the bleeding does not stop within a half an hour or if you feel faint, call our office at [phone] or go to the emergency room.  6. Problems are not common; however, if there is a substantial amount of bleeding, severe pain, chills, fever or difficulty passing urine (very rare) or other problems, you should call us at [phone] or report to the nearest emergency room.  7. Do not stay seated continuously for more than 2-3 hours for a day or two  after the procedure.  Tighten your buttock muscles 10-15 times every two hours and take 10-15 deep breaths every 1-2 hours.  Do not spend more than a few minutes on the toilet if you cannot empty your bowel; instead re-visit the toilet at a later time.   You have been scheduled for your 2nd banding on 11/21/2016 at 3:30pm

## 2016-10-01 NOTE — Progress Notes (Signed)
PROCEDURE NOTE: The patient presents with symptomatic grade II  hemorrhoids, requesting rubber band ligation of his/her hemorrhoidal disease.  All risks, benefits and alternative forms of therapy were described and informed consent was obtained.  In the Left Lateral Decubitus position anoscopic examination revealed grade II hemorrhoids in the Right anterior, right posterior and left lateral position(s).  The anorectum was pre-medicated with 0.125% Nitroglycerine and Recticare The decision was made to band the Right anterior internal hemorrhoid, and the [place] was used to perform band ligation without complication.  Digital anorectal examination was then performed to assure proper positioning of the band, and to adjust the banded tissue as required.  The patient was discharged home without pain or other issues.  Dietary and behavioral recommendations were given and along with follow-up instructions.    The patient will return in 2-4 weeks for  follow-up and possible additional banding as required. No complications were encountered and the patient tolerated the procedure well.  Damaris Hippo , MD [phone] Mon-Fri 8a-5p [phone] after 5p, weekends, holidays

## 2016-10-23 DIAGNOSIS — Z23 Encounter for immunization: Secondary | ICD-10-CM | POA: Diagnosis not present

## 2016-10-30 DIAGNOSIS — Z124 Encounter for screening for malignant neoplasm of cervix: Secondary | ICD-10-CM | POA: Diagnosis not present

## 2016-10-30 DIAGNOSIS — R1032 Left lower quadrant pain: Secondary | ICD-10-CM | POA: Diagnosis not present

## 2016-11-02 ENCOUNTER — Encounter: Payer: BLUE CROSS/BLUE SHIELD | Admitting: Nurse Practitioner

## 2016-11-15 ENCOUNTER — Encounter: Payer: Medicare Other | Admitting: Adult Health

## 2016-11-19 ENCOUNTER — Ambulatory Visit (HOSPITAL_BASED_OUTPATIENT_CLINIC_OR_DEPARTMENT_OTHER): Payer: Medicare Other | Admitting: Adult Health

## 2016-11-19 ENCOUNTER — Encounter: Payer: Self-pay | Admitting: Adult Health

## 2016-11-19 VITALS — BP 150/84 | HR 68 | Temp 98.8°F | Resp 18 | Wt 173.4 lb

## 2016-11-19 DIAGNOSIS — Z86000 Personal history of in-situ neoplasm of breast: Secondary | ICD-10-CM

## 2016-11-19 DIAGNOSIS — D0511 Intraductal carcinoma in situ of right breast: Secondary | ICD-10-CM

## 2016-11-19 NOTE — Progress Notes (Signed)
CLINIC:  Survivorship   REASON FOR VISIT:  Routine follow-up for history of breast cancer.   BRIEF ONCOLOGIC HISTORY:  Kristin Lewis is a 66 y.o. female.    1.  Underwent a routine screening mammogram that revealed multiple right breast calcifications. She had a biopsy performed that showed DCIS but MRI revealed an area of 9.8 x 7.7 x 5.9 cm diffuse enhancement.    2.  Patient is status post bilateral mastectomy with. Right mastectomy with sentinel lymph node biopsy on 07/15/2013 with a left prophylactic mastectomy. The final pathology revealed on the right side DCIS with calcifications high-grade spanning 10.2 cm. 2 sentinel nodes were negative for metastatic disease. All surgical resection margins were negative for carcinoma. Left simple mastectomy revealed atypical lobular hyperplasia Sentinel nodes were negative. DCIS was ER positive PR negative.  3.  10/2013 and 10/2014 underwent two reconstruction surgeries, (second one due to asymmetry) with implants.      INTERVAL HISTORY:  Kristin Lewis presents to the Survivorship Clinic today for routine follow-up for her history of breast cancer.  Overall, she reports feeling quite well.  Other than reconsidering fixing her plastic surgeries, she is well.  She is very active, and is redecorating her house.  She left her job last fall, and has lost weight and her blood pressure is improved.      REVIEW OF SYSTEMS:  Review of Systems  Constitutional: Negative for chills, diaphoresis, fever, malaise/fatigue and weight loss.  HENT: Negative for hearing loss and tinnitus.   Eyes: Negative for blurred vision and double vision.  Respiratory: Negative for cough and shortness of breath.   Cardiovascular: Negative for chest pain, palpitations and leg swelling.  Gastrointestinal: Negative for abdominal pain, blood in stool, constipation, diarrhea, heartburn, melena, nausea and vomiting.  Genitourinary: Negative for dysuria and urgency.    Musculoskeletal: Negative for joint pain and myalgias.  Skin: Negative for rash.  Neurological: Negative for dizziness, focal weakness, weakness and headaches.  Endo/Heme/Allergies: Negative for environmental allergies. Does not bruise/bleed easily.  Psychiatric/Behavioral: Negative for depression. The patient is not nervous/anxious.    Breast: Denies any new nodularity, masses, tenderness, nipple changes, or nipple discharge.      PAST MEDICAL/SURGICAL HISTORY:  Past Medical History:  Diagnosis Date  . Breast cancer ([facility])    "right only" (07/15/2013)  . Depression   . GERD (gastroesophageal reflux disease)    Past Surgical History:  Procedure Laterality Date  . BLEPHAROPLASTY Bilateral   . BREAST BIOPSY  06/17/2013   sterotactic/notes 06/23/2013  . BREAST RECONSTRUCTION    . MASTECTOMY Bilateral 07/15/2013   prophylactic left mastectomy/notes 06/23/2013  . MASTECTOMY W/ SENTINEL NODE BIOPSY Bilateral 07/15/2013   Procedure: BILATERAL MASTECTOMY WITH  RIGHT SENTINEL LYMPH NODE BIOPSY;  Surgeon: Imogene Burn. [name], MD;  Location: [facility];  Service: General;  Laterality: Bilateral;  RIGHT NUC MED INJECTION 10:30  . SENTINEL LYMPH NODE BIOPSY Right 07/15/2013  . TUBAL LIGATION    . tummy tuck       ALLERGIES:  No Known Allergies   CURRENT MEDICATIONS:  Outpatient Encounter Prescriptions as of 11/19/2016  Medication Sig Note  . cyanocobalamin 2000 MCG tablet Take 2,500 mcg by mouth daily.   . diclofenac sodium (VOLTAREN) 1 % GEL Apply 1 application topically 4 (four) times daily.   Marland Kitchen omeprazole (PRILOSEC) 40 MG capsule Take 40 mg by mouth daily.   . sertraline (ZOLOFT) 100 MG tablet Take 100 mg by mouth daily. 07/10/2013: Prescribed dose is BID,  but patient just taking once daily  . [DISCONTINUED] sertraline (ZOLOFT) 100 MG tablet Take by mouth.    Facility-Administered Encounter Medications as of 11/19/2016  Medication  . 0.9 %  sodium chloride infusion     ONCOLOGIC  FAMILY HISTORY:  Family History  Problem Relation Age of Onset  . Colon cancer Neg Hx   . Stomach cancer Neg Hx   . Rectal cancer Neg Hx   . Esophageal cancer Neg Hx   . Liver cancer Neg Hx     GENETIC COUNSELING/TESTING: no  SOCIAL HISTORY:  Kristin Lewis is single and lives alone in [place], [place].    Kristin Lewis is currently unemployed and taking a break from work, and has just restarted her job seeking for employment in [employer].  She denies any current or history of tobacco, alcohol, or illicit drug use.     PHYSICAL EXAMINATION:  Vital Signs: Vitals:   11/19/16 0956  BP: (!) 150/84  Pulse: 68  Resp: 18  Temp: 98.8 F (37.1 C)   Filed Weights   11/19/16 0956  Weight: 173 lb 6.4 oz (78.7 kg)   General: Well-nourished, well-appearing female in no acute distress.  Unaccompanied today.   HEENT: Head is normocephalic.  Pupils equal and reactive to light. Conjunctivae clear without exudate.  Sclerae anicteric. Oral mucosa is pink, moist.  Oropharynx is pink without lesions or erythema.  Lymph: No cervical, supraclavicular, or infraclavicular lymphadenopathy noted on palpation.  Cardiovascular: Regular rate and rhythm.Marland Kitchen Respiratory: Clear to auscultation bilaterally. Chest expansion symmetric; breathing non-labored.  Breast Exam:  s/p bilateral mastectomies with reconstruction and implant placement.  No swelling, mass, nodularity or sign of recurrence, benign bilateral breast exam.   -Axilla: No axillary adenopathy bilaterally.  GI: Abdomen soft and round; non-tender, non-distended. Bowel sounds normoactive. No hepatosplenomegaly.   GU: Deferred.  Neuro: No focal deficits. Steady gait.  Psych: Mood and affect normal and appropriate for situation.  Extremities: No edema. Skin: Warm and dry.  LABORATORY DATA:  None for this visit   DIAGNOSTIC IMAGING:  Most recent mammogram: n/a    ASSESSMENT AND PLAN:  Ms.. Lewis is a pleasant 66 y.o. female  with history of Stage 0 right sided DCIS, ER+/PR+/HER2-, diagnosed in 2014, treated with bilateral mastectomies.  She presents to the Survivorship Clinic for surveillance and routine follow-up.   1. History of breast cancer:  Kristin Lewis is currently clinically and radiographically without evidence of disease or recurrence of breast cancer.   I encouraged her to call me with any questions or concerns before her next visit at the cancer center, and I would be happy to see her sooner, if needed.    2. Bone health:  Given Kristin Lewis age, history of breast cancer, she is at slight risk for bone demineralization. Her last DEXA scan was within the past 6 months and was normal.  She was encouraged to continue her consumption of foods rich in calcium, as well as continue her weight-bearing activities.  She was given education on specific food and activities to promote bone health.  3. Cancer screening:  Due to Kristin Lewis's history and her age, she should receive screening for skin cancers, colon cancer, and gynecologic cancers. She was encouraged to follow-up with her PCP and gynecologist for appropriate cancer screenings.   4. Health maintenance and wellness promotion: Kristin Lewis was encouraged to consume 5-7 servings of fruits and vegetables per day. She was also encouraged to engage in moderate to vigorous  exercise for 30 minutes per day most days of the week. She was instructed to limit her alcohol consumption and continue to abstain from tobacco use.    Dispo:  -Return to cancer center in one year for long term survivor follow up   A total of (30) minutes of face-to-face time was spent with this patient with greater than 50% of that time in counseling and care-coordination.   Gardenia Phlegm, NP Survivorship Program [facility] [phone]   Note: PRIMARY CARE PROVIDER [place], [place] [phone]

## 2016-11-21 ENCOUNTER — Encounter: Payer: Self-pay | Admitting: Gastroenterology

## 2016-11-21 ENCOUNTER — Ambulatory Visit (INDEPENDENT_AMBULATORY_CARE_PROVIDER_SITE_OTHER): Payer: Medicare Other | Admitting: Gastroenterology

## 2016-11-21 VITALS — BP 114/72 | Ht 65.0 in | Wt 173.0 lb

## 2016-11-21 DIAGNOSIS — K641 Second degree hemorrhoids: Secondary | ICD-10-CM | POA: Diagnosis not present

## 2016-11-21 NOTE — Patient Instructions (Signed)

## 2016-11-21 NOTE — Progress Notes (Signed)
PROCEDURE NOTE: The patient presents with symptomatic grade II hemorrhoids, requesting rubber band ligation of his/her hemorrhoidal disease.  All risks, benefits and alternative forms of therapy were described and informed consent was obtained.   The anorectum was pre-medicated with 0.125% Nitroglycerine and Recticare The decision was made to band the Left lateral internal hemorrhoid, and the [facility] System was used to perform band ligation without complication.  Digital anorectal examination was then performed to assure proper positioning of the band, and to adjust the banded tissue as required.  The patient was discharged home without pain or other issues.  Dietary and behavioral recommendations were given and along with follow-up instructions.      The patient will return in 2-4 weeks for  follow-up and possible additional banding as required. No complications were encountered and the patient tolerated the procedure well.  K. Veena Jamilett Ferrante , MD [phone] Mon-Fri 8a-5p [phone] after 5p, weekends, holidays 

## 2016-11-27 DIAGNOSIS — R1032 Left lower quadrant pain: Secondary | ICD-10-CM | POA: Diagnosis not present

## 2017-01-04 ENCOUNTER — Encounter (INDEPENDENT_AMBULATORY_CARE_PROVIDER_SITE_OTHER): Payer: Self-pay

## 2017-01-04 ENCOUNTER — Encounter: Payer: Self-pay | Admitting: Gastroenterology

## 2017-01-04 ENCOUNTER — Ambulatory Visit (INDEPENDENT_AMBULATORY_CARE_PROVIDER_SITE_OTHER): Payer: Medicare Other | Admitting: Gastroenterology

## 2017-01-04 VITALS — BP 110/70 | HR 64 | Ht 65.0 in | Wt 171.2 lb

## 2017-01-04 DIAGNOSIS — K641 Second degree hemorrhoids: Secondary | ICD-10-CM

## 2017-01-04 NOTE — Progress Notes (Signed)
PROCEDURE NOTE: The patient presents with symptomatic grade II hemorrhoids, requesting rubber band ligation of his/her hemorrhoidal disease.  All risks, benefits and alternative forms of therapy were described and informed consent was obtained.   The anorectum was pre-medicated with 0.125% Nitroglycerine The decision was made to band the Right posterior internal hemorrhoid, and the [place] was used to perform band ligation without complication.  Digital anorectal examination was then performed to assure proper positioning of the band, and to adjust the banded tissue as required.  The patient was discharged home without pain or other issues.  Dietary and behavioral recommendations were given and along with follow-up instructions.      The patient will return for  follow-up and possible additional banding as required. No complications were encountered and the patient tolerated the procedure well.  Damaris Hippo , MD [phone] Mon-Fri 8a-5p [phone] after 5p, weekends, holidays

## 2017-01-04 NOTE — Patient Instructions (Signed)

## 2017-01-07 ENCOUNTER — Telehealth: Payer: Self-pay | Admitting: Gastroenterology

## 2017-01-07 DIAGNOSIS — E039 Hypothyroidism, unspecified: Secondary | ICD-10-CM | POA: Diagnosis not present

## 2017-01-07 DIAGNOSIS — Z79899 Other long term (current) drug therapy: Secondary | ICD-10-CM | POA: Diagnosis not present

## 2017-01-07 DIAGNOSIS — I10 Essential (primary) hypertension: Secondary | ICD-10-CM | POA: Diagnosis not present

## 2017-01-07 DIAGNOSIS — E781 Pure hyperglyceridemia: Secondary | ICD-10-CM | POA: Diagnosis not present

## 2017-01-07 DIAGNOSIS — Z136 Encounter for screening for cardiovascular disorders: Secondary | ICD-10-CM | POA: Diagnosis not present

## 2017-01-08 NOTE — Telephone Encounter (Signed)
Patient has not been constipated. Agrees to an appointment. Scheduled into the first opening on 01/10/17 at 3pm

## 2017-01-08 NOTE — Telephone Encounter (Signed)
Blood with the bowel movement last week twice. No pain. She saw blood on the tissue yesterday. Now some discomfort. She has not been constipated. She says she was not worried but there is a little discomfort.

## 2017-01-08 NOTE — Telephone Encounter (Signed)
Please advise patient to take Benefiber 1 table spoon TID with meals. Miralax 1 capful daily. Increase fluid intake. Schedule office visit to evaluate. I am concerned about possible anal fissure

## 2017-01-10 ENCOUNTER — Ambulatory Visit (INDEPENDENT_AMBULATORY_CARE_PROVIDER_SITE_OTHER): Payer: Medicare Other | Admitting: Nurse Practitioner

## 2017-01-10 ENCOUNTER — Encounter: Payer: Self-pay | Admitting: Nurse Practitioner

## 2017-01-10 ENCOUNTER — Encounter (INDEPENDENT_AMBULATORY_CARE_PROVIDER_SITE_OTHER): Payer: Self-pay

## 2017-01-10 VITALS — BP 128/76 | HR 64 | Ht 65.0 in | Wt 172.4 lb

## 2017-01-10 DIAGNOSIS — K648 Other hemorrhoids: Secondary | ICD-10-CM | POA: Diagnosis not present

## 2017-01-10 DIAGNOSIS — K625 Hemorrhage of anus and rectum: Secondary | ICD-10-CM

## 2017-01-10 NOTE — Progress Notes (Signed)
     HPI: Patient is a 66 year old female known to Dr. Gwendalyn Ege began. She was found to have internal hemorrhoids on colonoscopy in February of this year. She's been undergoing band ligation of the hemorrhoids and just had her third hemorrhoid banding done Friday 01/04/17. Saturday she passed a small amount of blood tinged fluid from her rectum. On Sunday afternoon she asked a larger amount of the same blood-tinged fluid. No rectal pain. She had some mild lower abdominal discomfort.. She's had no further bleeding in 4 days. Her bowel movements are normal. Patient is to make sure that nothing was wrong. He does not take NSAIDs or blood thinners    Past Medical History:  Diagnosis Date  . Breast cancer ([facility])    "right only" (07/15/2013)  . Depression   . GERD (gastroesophageal reflux disease)     Patient's surgical history, family medical history, social history, medications and allergies were all reviewed in Epic    Physical Exam: BP 128/76 (BP Location: Left Arm, Patient Position: Sitting, Cuff Size: Normal)   Pulse 64   Ht 5\' 5"  (1.651 m)   Wt 172 lb 6 oz (78.2 kg)   BMI 28.68 kg/m   GENERAL: pleasant white female in NAD PSYCH: :Pleasant, cooperative, normal affect CARDIAC:  RRR, no peripheral edema ABDOMEN:  soft, nontender, nondistended, no obvious masses, no hepatomegaly,  normal bowel sounds RECTAL: No external lesions. No abnormalities on DRE. On anoscopy there were no internal hemorrhoids no ulcers at banding sites. Anoscopy was normal. NEURO: Alert and oriented x 3, no focal neurologic deficits   ASSESSMENT and PLAN:  Pleasant 66 year old female is been undergoing band ligation of internal hemorrhoids, last banding Friday, 01/04/17 . patient passed some blood tinged fluid from her rectum Saturday morning, a large amount on Sunday . Some mild associated lower abdominal discomfort.  No further discomfort nor bleeding in 4 days, her bowel movements are normal. She just wanted to make  sure everything was okay . Anoscopic exam was normal. Reassurance provided. She will call us back for any recurrent bleeding   Tye Savoy , NP 01/10/2017, 4:22 PM

## 2017-01-10 NOTE — Patient Instructions (Signed)
If you are age 66 or older, your body mass index should be between 23-30. Your Body mass index is 28.68 kg/m. If this is out of the aforementioned range listed, please consider follow up with your Primary Care Provider.  If you are age 59 or younger, your body mass index should be between 19-25. Your Body mass index is 28.68 kg/m. If this is out of the aformentioned range listed, please consider follow up with your Primary Care Provider.   Follow up as needed.  Thank you for choosing me and [facility].   Tye Savoy, NP

## 2017-01-12 NOTE — Progress Notes (Signed)
Reviewed and agree with documentation and assessment and plan. K. Veena Amzie Sillas , MD   

## 2017-02-12 DIAGNOSIS — K648 Other hemorrhoids: Secondary | ICD-10-CM | POA: Diagnosis not present

## 2017-02-12 DIAGNOSIS — F330 Major depressive disorder, recurrent, mild: Secondary | ICD-10-CM | POA: Diagnosis not present

## 2017-02-12 DIAGNOSIS — K219 Gastro-esophageal reflux disease without esophagitis: Secondary | ICD-10-CM | POA: Diagnosis not present

## 2017-02-12 DIAGNOSIS — F411 Generalized anxiety disorder: Secondary | ICD-10-CM | POA: Diagnosis not present

## 2017-04-02 DIAGNOSIS — Z23 Encounter for immunization: Secondary | ICD-10-CM | POA: Diagnosis not present

## 2017-04-02 DIAGNOSIS — F330 Major depressive disorder, recurrent, mild: Secondary | ICD-10-CM | POA: Diagnosis not present

## 2017-04-02 DIAGNOSIS — F411 Generalized anxiety disorder: Secondary | ICD-10-CM | POA: Diagnosis not present

## 2017-04-02 DIAGNOSIS — L309 Dermatitis, unspecified: Secondary | ICD-10-CM | POA: Diagnosis not present

## 2017-04-02 DIAGNOSIS — Z0000 Encounter for general adult medical examination without abnormal findings: Secondary | ICD-10-CM | POA: Diagnosis not present

## 2017-04-03 DIAGNOSIS — Z79899 Other long term (current) drug therapy: Secondary | ICD-10-CM | POA: Diagnosis not present

## 2017-04-03 DIAGNOSIS — D649 Anemia, unspecified: Secondary | ICD-10-CM | POA: Diagnosis not present

## 2017-06-17 DIAGNOSIS — F330 Major depressive disorder, recurrent, mild: Secondary | ICD-10-CM | POA: Diagnosis not present

## 2017-06-17 DIAGNOSIS — K219 Gastro-esophageal reflux disease without esophagitis: Secondary | ICD-10-CM | POA: Diagnosis not present

## 2017-06-17 DIAGNOSIS — F411 Generalized anxiety disorder: Secondary | ICD-10-CM | POA: Diagnosis not present

## 2017-06-17 DIAGNOSIS — Z23 Encounter for immunization: Secondary | ICD-10-CM | POA: Diagnosis not present

## 2017-06-17 DIAGNOSIS — Z6826 Body mass index (BMI) 26.0-26.9, adult: Secondary | ICD-10-CM | POA: Diagnosis not present

## 2017-07-11 DIAGNOSIS — H5203 Hypermetropia, bilateral: Secondary | ICD-10-CM | POA: Diagnosis not present

## 2017-07-11 DIAGNOSIS — H524 Presbyopia: Secondary | ICD-10-CM | POA: Diagnosis not present

## 2017-07-11 DIAGNOSIS — H52223 Regular astigmatism, bilateral: Secondary | ICD-10-CM | POA: Diagnosis not present

## 2017-11-21 ENCOUNTER — Inpatient Hospital Stay: Payer: Medicare Other | Admitting: Adult Health

## 2018-01-27 DIAGNOSIS — R1032 Left lower quadrant pain: Secondary | ICD-10-CM | POA: Diagnosis not present

## 2018-01-27 DIAGNOSIS — Z6827 Body mass index (BMI) 27.0-27.9, adult: Secondary | ICD-10-CM | POA: Diagnosis not present

## 2018-02-04 DIAGNOSIS — Z6827 Body mass index (BMI) 27.0-27.9, adult: Secondary | ICD-10-CM | POA: Diagnosis not present

## 2018-02-04 DIAGNOSIS — K5792 Diverticulitis of intestine, part unspecified, without perforation or abscess without bleeding: Secondary | ICD-10-CM | POA: Diagnosis not present

## 2018-04-03 DIAGNOSIS — Z0000 Encounter for general adult medical examination without abnormal findings: Secondary | ICD-10-CM | POA: Diagnosis not present

## 2018-04-03 DIAGNOSIS — R109 Unspecified abdominal pain: Secondary | ICD-10-CM | POA: Diagnosis not present

## 2018-04-03 DIAGNOSIS — Z6825 Body mass index (BMI) 25.0-25.9, adult: Secondary | ICD-10-CM | POA: Diagnosis not present

## 2018-04-03 DIAGNOSIS — Z1211 Encounter for screening for malignant neoplasm of colon: Secondary | ICD-10-CM | POA: Diagnosis not present

## 2018-04-03 DIAGNOSIS — K5900 Constipation, unspecified: Secondary | ICD-10-CM | POA: Diagnosis not present

## 2018-04-03 DIAGNOSIS — K219 Gastro-esophageal reflux disease without esophagitis: Secondary | ICD-10-CM | POA: Diagnosis not present

## 2018-04-03 DIAGNOSIS — K5792 Diverticulitis of intestine, part unspecified, without perforation or abscess without bleeding: Secondary | ICD-10-CM | POA: Diagnosis not present

## 2018-04-03 DIAGNOSIS — Z23 Encounter for immunization: Secondary | ICD-10-CM | POA: Diagnosis not present

## 2018-06-06 DIAGNOSIS — R030 Elevated blood-pressure reading, without diagnosis of hypertension: Secondary | ICD-10-CM | POA: Diagnosis not present

## 2018-06-06 DIAGNOSIS — E559 Vitamin D deficiency, unspecified: Secondary | ICD-10-CM | POA: Diagnosis not present

## 2018-06-06 DIAGNOSIS — Z79899 Other long term (current) drug therapy: Secondary | ICD-10-CM | POA: Diagnosis not present

## 2018-06-10 DIAGNOSIS — Z6827 Body mass index (BMI) 27.0-27.9, adult: Secondary | ICD-10-CM | POA: Diagnosis not present

## 2018-06-10 DIAGNOSIS — K219 Gastro-esophageal reflux disease without esophagitis: Secondary | ICD-10-CM | POA: Diagnosis not present

## 2018-06-10 DIAGNOSIS — E559 Vitamin D deficiency, unspecified: Secondary | ICD-10-CM | POA: Diagnosis not present

## 2018-06-10 DIAGNOSIS — F330 Major depressive disorder, recurrent, mild: Secondary | ICD-10-CM | POA: Diagnosis not present

## 2018-12-08 ENCOUNTER — Ambulatory Visit: Payer: Medicare Other | Admitting: Podiatry

## 2019-01-05 ENCOUNTER — Encounter: Payer: Self-pay | Admitting: Podiatry

## 2019-01-05 ENCOUNTER — Other Ambulatory Visit: Payer: Self-pay

## 2019-01-05 ENCOUNTER — Ambulatory Visit (INDEPENDENT_AMBULATORY_CARE_PROVIDER_SITE_OTHER): Payer: Medicare Other | Admitting: Podiatry

## 2019-01-05 VITALS — BP 138/70 | HR 62 | Temp 97.9°F | Resp 16

## 2019-01-05 DIAGNOSIS — B351 Tinea unguium: Secondary | ICD-10-CM | POA: Diagnosis not present

## 2019-01-05 MED ORDER — TERBINAFINE HCL 250 MG PO TABS
ORAL_TABLET | ORAL | 0 refills | Status: DC
Start: 2019-01-05 — End: 2020-11-10

## 2019-01-05 NOTE — Progress Notes (Signed)
   Subjective:    Patient ID: Kristin Lewis, female    DOB: [birth year], 68 y.o.   MRN: [id number]  HPI    Review of Systems  All other systems reviewed and are negative.      Objective:   Physical Exam        Assessment & Plan:

## 2019-01-05 NOTE — Progress Notes (Signed)
Subjective:   Patient ID: Kristin Lewis, female   DOB: 68 y.o.   MRN: [id number]   HPI Patient presents with nail disease of all nails on the right foot and states that at times she is try to use over-the-counter medicines without success.  Patient states that they do not grow normally and she would like something to try to improve   Review of Systems  All other systems reviewed and are negative.       Objective:  Physical Exam Vitals signs and nursing note reviewed.  Constitutional:      Appearance: She is well-developed.  Pulmonary:     Effort: Pulmonary effort is normal.  Musculoskeletal: Normal range of motion.  Skin:    General: Skin is warm.  Neurological:     Mental Status: She is alert.     Neurovascular status found to be intact muscle strength is adequate range of motion within normal limits with patient found to have discolored nailbeds 1 through 5 right foot with thick dystrophic changes and nontender but they are cosmetically bothersome for her.  Good digital perfusion well oriented x3     Assessment:  Combination of mycotic nail infection 1-5 right with probability of trauma as a precipitating factor     Plan:  H&P conditions reviewed at today I have recommended a combination of pulsed Lamisil therapy along with laser and topical.  Educated her on this effect there is no guarantee this will solve it and she wants to go through with this and will start medication and is scheduled for laser of the right nails 1 through 5

## 2019-01-13 ENCOUNTER — Other Ambulatory Visit: Payer: Self-pay

## 2019-01-13 ENCOUNTER — Ambulatory Visit: Payer: Self-pay

## 2019-01-13 DIAGNOSIS — B351 Tinea unguium: Secondary | ICD-10-CM

## 2019-01-13 NOTE — Patient Instructions (Signed)

## 2019-01-19 NOTE — Progress Notes (Signed)
Pt presents with mycotic infection of nails 1-5 bilateral.  All other systems are negative  Laser therapy administered to affected nails and tolerated well. All safety precautions were in place.  2nd treatment.  Follow up in 4 weeks     

## 2019-02-05 DIAGNOSIS — M79604 Pain in right leg: Secondary | ICD-10-CM | POA: Diagnosis not present

## 2019-02-05 DIAGNOSIS — S8011XA Contusion of right lower leg, initial encounter: Secondary | ICD-10-CM | POA: Diagnosis not present

## 2019-02-10 ENCOUNTER — Other Ambulatory Visit: Payer: Self-pay

## 2019-02-10 ENCOUNTER — Ambulatory Visit (INDEPENDENT_AMBULATORY_CARE_PROVIDER_SITE_OTHER): Payer: Medicare Other

## 2019-02-10 DIAGNOSIS — B351 Tinea unguium: Secondary | ICD-10-CM

## 2019-02-10 NOTE — Progress Notes (Signed)
Pt presents with mycotic infection of nails 1-5 bilateral.  All other systems are negative  Laser therapy administered to affected nails and tolerated well. All safety precautions were in place.  1st treatment.  Follow up in 4 weeks        Ortho Rx steroid dosepack, advised to finish steroid then start lamisil pulse 1 week later

## 2019-02-10 NOTE — Patient Instructions (Signed)
Follow-up care is important to the success of your foot laser treatment. Please keep these instructions for future reference.    1. Normal activity can resume immediately.   2. IF you doctor prescribed an antifungal cream apply medication to the skin on the bottom of your feet, sides of your feet, between your toes, and around your nails. (This cream is not intended for use on your nails) Apply cream as directed  3. IF a topical for your nails was prescribed by your doctor, apply to nail/nails once daily until nail is free from infection unless otherwise directed by your doctor. Maximum use for nail topical is 12 months.   4. Spray the insides of your shoes with an over the counter antifungal spray or Lysol disinfectant (aerosol can) at the end of each day or use an ultra violet shoe sterilizer as directed. Try not to wear the same shoes everyday.   5. Buy new nail clippers and files since your current pair may be infected. Metal nail care instruments may also be cleaned with diluted bleach or boiling water. DO NOT share nail clippers or nail files.  6. Keep your toenails trimmed and clean.   7. Wear flip-flops in public places especially hotel rooms and showers, athletic club locker rooms and showers and indoor swimming pools.   8. Avoid nail salons that do not clean their instruments properly or use a whirlpool system. 3 tablespoon of: coconut oil, olive oil, or almond oil ( any carrier oil) 10-15 drops of tea tree oil  Use mixture, rub into feet and toenails 2-3 times a week

## 2019-03-13 ENCOUNTER — Other Ambulatory Visit: Payer: Self-pay

## 2019-03-13 ENCOUNTER — Ambulatory Visit: Payer: Self-pay | Admitting: Podiatry

## 2019-03-13 DIAGNOSIS — B351 Tinea unguium: Secondary | ICD-10-CM

## 2019-03-13 NOTE — Progress Notes (Signed)
Pt presents with mycotic infection of nails 1-5 bilateral.  All other systems are negative  Laser therapy administered to affected nails and tolerated well. All safety precautions were in place.  2nd treatment.  Follow up in 4 weeks     

## 2019-04-09 ENCOUNTER — Ambulatory Visit (INDEPENDENT_AMBULATORY_CARE_PROVIDER_SITE_OTHER): Payer: Self-pay

## 2019-04-09 ENCOUNTER — Other Ambulatory Visit: Payer: Self-pay

## 2019-04-09 DIAGNOSIS — B351 Tinea unguium: Secondary | ICD-10-CM

## 2019-04-09 DIAGNOSIS — M79676 Pain in unspecified toe(s): Secondary | ICD-10-CM

## 2019-04-10 ENCOUNTER — Other Ambulatory Visit: Payer: Medicare Other

## 2019-05-04 DIAGNOSIS — E559 Vitamin D deficiency, unspecified: Secondary | ICD-10-CM | POA: Diagnosis not present

## 2019-05-07 NOTE — Progress Notes (Signed)
Pt presents with mycotic infection of nails 1-5 bilateral.  All other systems are negative  Laser therapy administered to affected nails and tolerated well. All safety precautions were in place.  3rd treatment.  Follow up in 4 weeks     

## 2019-05-08 ENCOUNTER — Other Ambulatory Visit: Payer: Self-pay | Admitting: Family Medicine

## 2019-05-08 DIAGNOSIS — F330 Major depressive disorder, recurrent, mild: Secondary | ICD-10-CM | POA: Diagnosis not present

## 2019-05-08 DIAGNOSIS — E559 Vitamin D deficiency, unspecified: Secondary | ICD-10-CM | POA: Diagnosis not present

## 2019-05-08 DIAGNOSIS — B351 Tinea unguium: Secondary | ICD-10-CM | POA: Diagnosis not present

## 2019-05-08 DIAGNOSIS — F411 Generalized anxiety disorder: Secondary | ICD-10-CM | POA: Diagnosis not present

## 2019-05-08 DIAGNOSIS — Z0000 Encounter for general adult medical examination without abnormal findings: Secondary | ICD-10-CM | POA: Diagnosis not present

## 2019-05-08 DIAGNOSIS — M79661 Pain in right lower leg: Secondary | ICD-10-CM | POA: Diagnosis not present

## 2019-05-11 ENCOUNTER — Other Ambulatory Visit: Payer: Self-pay | Admitting: Family Medicine

## 2019-05-11 DIAGNOSIS — E2839 Other primary ovarian failure: Secondary | ICD-10-CM

## 2019-05-12 DIAGNOSIS — M79661 Pain in right lower leg: Secondary | ICD-10-CM | POA: Diagnosis not present

## 2019-05-13 DIAGNOSIS — Z23 Encounter for immunization: Secondary | ICD-10-CM | POA: Diagnosis not present

## 2019-06-04 ENCOUNTER — Other Ambulatory Visit: Payer: Medicare Other

## 2019-06-12 ENCOUNTER — Other Ambulatory Visit: Payer: Self-pay | Admitting: Family Medicine

## 2019-06-12 ENCOUNTER — Ambulatory Visit (INDEPENDENT_AMBULATORY_CARE_PROVIDER_SITE_OTHER): Payer: Medicare Other

## 2019-06-12 ENCOUNTER — Other Ambulatory Visit: Payer: Self-pay

## 2019-06-12 DIAGNOSIS — M79661 Pain in right lower leg: Secondary | ICD-10-CM | POA: Diagnosis not present

## 2019-06-12 DIAGNOSIS — E559 Vitamin D deficiency, unspecified: Secondary | ICD-10-CM | POA: Diagnosis not present

## 2019-06-12 DIAGNOSIS — C50819 Malignant neoplasm of overlapping sites of unspecified female breast: Secondary | ICD-10-CM | POA: Diagnosis not present

## 2019-06-12 DIAGNOSIS — B351 Tinea unguium: Secondary | ICD-10-CM

## 2019-06-12 NOTE — Progress Notes (Signed)
Pt presents with mycotic infection of nails 1-5 bilateral  All other systems are negative  Laser therapy administered to affected nails and tolerated well. All safety precautions were in place. 4th treatment.  Follow up in 4 weeks     

## 2019-07-14 ENCOUNTER — Ambulatory Visit
Admission: RE | Admit: 2019-07-14 | Discharge: 2019-07-14 | Disposition: A | Discharge status: Home / Self Care | Payer: Medicare Other | Source: Ambulatory Visit | Attending: Family Medicine | Admitting: Family Medicine

## 2019-07-14 ENCOUNTER — Other Ambulatory Visit: Payer: Self-pay

## 2019-07-14 DIAGNOSIS — M79661 Pain in right lower leg: Secondary | ICD-10-CM | POA: Diagnosis not present

## 2019-07-14 MED ORDER — GADOBENATE DIMEGLUMINE 529 MG/ML IV SOLN
15.0000 mL | Freq: Once | INTRAVENOUS | Status: AC | PRN
  Administered 2019-07-14: 15 mL via INTRAVENOUS

## 2019-07-17 ENCOUNTER — Other Ambulatory Visit: Payer: Self-pay | Admitting: Family Medicine

## 2019-07-17 DIAGNOSIS — R9389 Abnormal findings on diagnostic imaging of other specified body structures: Secondary | ICD-10-CM

## 2019-07-23 DIAGNOSIS — E782 Mixed hyperlipidemia: Secondary | ICD-10-CM | POA: Diagnosis not present

## 2019-07-23 DIAGNOSIS — Z79899 Other long term (current) drug therapy: Secondary | ICD-10-CM | POA: Diagnosis not present

## 2019-07-23 DIAGNOSIS — E039 Hypothyroidism, unspecified: Secondary | ICD-10-CM | POA: Diagnosis not present

## 2019-07-23 DIAGNOSIS — I10 Essential (primary) hypertension: Secondary | ICD-10-CM | POA: Diagnosis not present

## 2019-07-24 ENCOUNTER — Other Ambulatory Visit: Payer: Medicare Other

## 2019-08-08 ENCOUNTER — Other Ambulatory Visit: Payer: Self-pay

## 2019-08-08 ENCOUNTER — Ambulatory Visit
Admission: RE | Admit: 2019-08-08 | Discharge: 2019-08-08 | Disposition: A | Discharge status: Home / Self Care | Payer: Medicare Other | Source: Ambulatory Visit | Attending: Family Medicine | Admitting: Family Medicine

## 2019-08-08 DIAGNOSIS — R9389 Abnormal findings on diagnostic imaging of other specified body structures: Secondary | ICD-10-CM

## 2019-10-07 DIAGNOSIS — Z79899 Other long term (current) drug therapy: Secondary | ICD-10-CM | POA: Diagnosis not present

## 2019-10-07 DIAGNOSIS — E782 Mixed hyperlipidemia: Secondary | ICD-10-CM | POA: Diagnosis not present

## 2019-10-07 DIAGNOSIS — E559 Vitamin D deficiency, unspecified: Secondary | ICD-10-CM | POA: Diagnosis not present

## 2019-10-07 DIAGNOSIS — E039 Hypothyroidism, unspecified: Secondary | ICD-10-CM | POA: Diagnosis not present

## 2019-10-13 DIAGNOSIS — M19071 Primary osteoarthritis, right ankle and foot: Secondary | ICD-10-CM | POA: Diagnosis not present

## 2019-10-13 DIAGNOSIS — M7661 Achilles tendinitis, right leg: Secondary | ICD-10-CM | POA: Diagnosis not present

## 2019-10-13 DIAGNOSIS — E559 Vitamin D deficiency, unspecified: Secondary | ICD-10-CM | POA: Diagnosis not present

## 2019-10-13 DIAGNOSIS — E782 Mixed hyperlipidemia: Secondary | ICD-10-CM | POA: Diagnosis not present

## 2020-02-01 ENCOUNTER — Ambulatory Visit: Payer: Medicare Other | Admitting: Podiatry

## 2020-04-07 DIAGNOSIS — E782 Mixed hyperlipidemia: Secondary | ICD-10-CM | POA: Diagnosis not present

## 2020-04-07 DIAGNOSIS — E559 Vitamin D deficiency, unspecified: Secondary | ICD-10-CM | POA: Diagnosis not present

## 2020-04-14 DIAGNOSIS — G4700 Insomnia, unspecified: Secondary | ICD-10-CM | POA: Diagnosis not present

## 2020-04-14 DIAGNOSIS — Z0000 Encounter for general adult medical examination without abnormal findings: Secondary | ICD-10-CM | POA: Diagnosis not present

## 2020-04-14 DIAGNOSIS — Z1331 Encounter for screening for depression: Secondary | ICD-10-CM | POA: Diagnosis not present

## 2020-04-14 DIAGNOSIS — E782 Mixed hyperlipidemia: Secondary | ICD-10-CM | POA: Diagnosis not present

## 2020-04-14 DIAGNOSIS — F411 Generalized anxiety disorder: Secondary | ICD-10-CM | POA: Diagnosis not present

## 2020-04-14 DIAGNOSIS — Z1339 Encounter for screening examination for other mental health and behavioral disorders: Secondary | ICD-10-CM | POA: Diagnosis not present

## 2020-04-14 DIAGNOSIS — E559 Vitamin D deficiency, unspecified: Secondary | ICD-10-CM | POA: Diagnosis not present

## 2020-04-25 DIAGNOSIS — Z23 Encounter for immunization: Secondary | ICD-10-CM | POA: Diagnosis not present

## 2020-05-09 DIAGNOSIS — Z01812 Encounter for preprocedural laboratory examination: Secondary | ICD-10-CM | POA: Diagnosis not present

## 2020-05-12 DIAGNOSIS — R937 Abnormal findings on diagnostic imaging of other parts of musculoskeletal system: Secondary | ICD-10-CM | POA: Diagnosis not present

## 2020-05-12 DIAGNOSIS — E559 Vitamin D deficiency, unspecified: Secondary | ICD-10-CM | POA: Diagnosis not present

## 2020-05-12 DIAGNOSIS — Z7185 Encounter for immunization safety counseling: Secondary | ICD-10-CM | POA: Diagnosis not present

## 2020-05-12 DIAGNOSIS — F411 Generalized anxiety disorder: Secondary | ICD-10-CM | POA: Diagnosis not present

## 2020-05-20 DIAGNOSIS — Z20822 Contact with and (suspected) exposure to covid-19: Secondary | ICD-10-CM | POA: Diagnosis not present

## 2020-05-26 ENCOUNTER — Other Ambulatory Visit: Payer: Self-pay | Admitting: Family Medicine

## 2020-05-26 DIAGNOSIS — E2839 Other primary ovarian failure: Secondary | ICD-10-CM

## 2020-07-11 DIAGNOSIS — Z23 Encounter for immunization: Secondary | ICD-10-CM | POA: Diagnosis not present

## 2020-07-14 DIAGNOSIS — E782 Mixed hyperlipidemia: Secondary | ICD-10-CM | POA: Diagnosis not present

## 2020-07-21 DIAGNOSIS — E1169 Type 2 diabetes mellitus with other specified complication: Secondary | ICD-10-CM | POA: Diagnosis not present

## 2020-07-21 DIAGNOSIS — Z7185 Encounter for immunization safety counseling: Secondary | ICD-10-CM | POA: Diagnosis not present

## 2020-07-21 DIAGNOSIS — K219 Gastro-esophageal reflux disease without esophagitis: Secondary | ICD-10-CM | POA: Diagnosis not present

## 2020-07-21 DIAGNOSIS — F411 Generalized anxiety disorder: Secondary | ICD-10-CM | POA: Diagnosis not present

## 2020-07-21 DIAGNOSIS — E782 Mixed hyperlipidemia: Secondary | ICD-10-CM | POA: Diagnosis not present

## 2020-07-21 DIAGNOSIS — F330 Major depressive disorder, recurrent, mild: Secondary | ICD-10-CM | POA: Diagnosis not present

## 2020-09-14 ENCOUNTER — Other Ambulatory Visit: Payer: Medicare Other

## 2020-11-10 ENCOUNTER — Encounter: Payer: Self-pay | Admitting: Podiatry

## 2020-11-10 ENCOUNTER — Other Ambulatory Visit: Payer: Self-pay

## 2020-11-10 ENCOUNTER — Ambulatory Visit (INDEPENDENT_AMBULATORY_CARE_PROVIDER_SITE_OTHER): Payer: Medicare Other | Admitting: Podiatry

## 2020-11-10 DIAGNOSIS — Z23 Encounter for immunization: Secondary | ICD-10-CM | POA: Diagnosis not present

## 2020-11-10 DIAGNOSIS — B351 Tinea unguium: Secondary | ICD-10-CM | POA: Diagnosis not present

## 2020-11-10 MED ORDER — TERBINAFINE HCL 250 MG PO TABS: ORAL_TABLET | ORAL | 0 refills | Status: DC

## 2020-11-11 NOTE — Progress Notes (Signed)
Subjective:   Patient ID: Kristin Lewis, female   DOB: 70 y.o.   MRN: [id number]   HPI Patient states that it has been several years and she wonders whether or not she could have more nail treatments.  She has had discoloration has had good results but several nails do have some distal discoloration   ROS      Objective:  Physical Exam  Neurovascular status intact with distal discoloration of hallux nails bilateral several nails on both feet     Assessment:  Mycotic nail infection bilateral that has had good response but has some reoccurrence     Plan:  I recommended a pulse pack Lamisil along with laser treatment and that we can continue to work on this but occasionally she may need treatments for this.  Patient does want to get this done and will be seen back for laser treatment and was given all encouragement for this today

## 2020-11-17 ENCOUNTER — Ambulatory Visit (INDEPENDENT_AMBULATORY_CARE_PROVIDER_SITE_OTHER): Payer: Medicare Other

## 2020-11-17 ENCOUNTER — Other Ambulatory Visit: Payer: Self-pay

## 2020-11-17 DIAGNOSIS — B351 Tinea unguium: Secondary | ICD-10-CM

## 2020-11-17 NOTE — Patient Instructions (Signed)

## 2020-11-17 NOTE — Progress Notes (Signed)
Patient presents today for the 1st laser treatment. Diagnosed with mycotic nail infection by Dr. Paulla Dolly.   Toenail most affected 3rd right.  All other systems are negative.  Nails were filed thin. Laser therapy was administered to 1-5 bilateral toenails and patient tolerated the treatment well. All safety precautions were in place.   Patient currently taking lasmisil at this time.    Follow up in 6 weeks for laser # 2.

## 2020-12-06 ENCOUNTER — Ambulatory Visit
Admission: RE | Admit: 2020-12-06 | Discharge: 2020-12-06 | Disposition: A | Discharge status: Home / Self Care | Payer: Medicare Other | Source: Ambulatory Visit | Attending: Family Medicine | Admitting: Family Medicine

## 2020-12-06 ENCOUNTER — Other Ambulatory Visit: Payer: Self-pay

## 2020-12-06 DIAGNOSIS — E2839 Other primary ovarian failure: Secondary | ICD-10-CM

## 2020-12-06 DIAGNOSIS — Z780 Asymptomatic menopausal state: Secondary | ICD-10-CM | POA: Diagnosis not present

## 2020-12-08 DIAGNOSIS — H25813 Combined forms of age-related cataract, bilateral: Secondary | ICD-10-CM | POA: Diagnosis not present

## 2020-12-08 DIAGNOSIS — Z460 Encounter for fitting and adjustment of spectacles and contact lenses: Secondary | ICD-10-CM | POA: Diagnosis not present

## 2020-12-08 DIAGNOSIS — Z973 Presence of spectacles and contact lenses: Secondary | ICD-10-CM | POA: Diagnosis not present

## 2020-12-08 DIAGNOSIS — H35371 Puckering of macula, right eye: Secondary | ICD-10-CM | POA: Diagnosis not present

## 2020-12-14 DIAGNOSIS — K219 Gastro-esophageal reflux disease without esophagitis: Secondary | ICD-10-CM | POA: Diagnosis not present

## 2020-12-14 DIAGNOSIS — M810 Age-related osteoporosis without current pathological fracture: Secondary | ICD-10-CM | POA: Diagnosis not present

## 2020-12-14 DIAGNOSIS — E559 Vitamin D deficiency, unspecified: Secondary | ICD-10-CM | POA: Diagnosis not present

## 2020-12-14 DIAGNOSIS — Z7185 Encounter for immunization safety counseling: Secondary | ICD-10-CM | POA: Diagnosis not present

## 2020-12-14 DIAGNOSIS — M79606 Pain in leg, unspecified: Secondary | ICD-10-CM | POA: Diagnosis not present

## 2020-12-28 ENCOUNTER — Encounter: Payer: Self-pay | Admitting: Neurology

## 2020-12-30 ENCOUNTER — Other Ambulatory Visit: Payer: Self-pay

## 2020-12-30 ENCOUNTER — Ambulatory Visit (INDEPENDENT_AMBULATORY_CARE_PROVIDER_SITE_OTHER): Payer: Medicare Other

## 2020-12-30 DIAGNOSIS — B351 Tinea unguium: Secondary | ICD-10-CM

## 2020-12-30 NOTE — Progress Notes (Signed)
Patient presents today for the 2nd laser treatment. Diagnosed with mycotic nail infection by Dr. Paulla Dolly.   Toenail most affected 3rd right.  All other systems are negative.  Nails were filed thin. Laser therapy was administered to 1-5 bilateral toenails and patient tolerated the treatment well. All safety precautions were in place.   Patient currently taking lasmisil at this time.    Follow up in 6 weeks for laser # 3.

## 2020-12-30 NOTE — Patient Instructions (Signed)

## 2021-01-18 DIAGNOSIS — L820 Inflamed seborrheic keratosis: Secondary | ICD-10-CM | POA: Diagnosis not present

## 2021-01-18 DIAGNOSIS — L821 Other seborrheic keratosis: Secondary | ICD-10-CM | POA: Diagnosis not present

## 2021-01-18 DIAGNOSIS — D225 Melanocytic nevi of trunk: Secondary | ICD-10-CM | POA: Diagnosis not present

## 2021-01-18 DIAGNOSIS — C44519 Basal cell carcinoma of skin of other part of trunk: Secondary | ICD-10-CM | POA: Diagnosis not present

## 2021-02-08 DIAGNOSIS — E782 Mixed hyperlipidemia: Secondary | ICD-10-CM | POA: Diagnosis not present

## 2021-02-08 DIAGNOSIS — R7309 Other abnormal glucose: Secondary | ICD-10-CM | POA: Diagnosis not present

## 2021-02-08 DIAGNOSIS — E559 Vitamin D deficiency, unspecified: Secondary | ICD-10-CM | POA: Diagnosis not present

## 2021-02-17 ENCOUNTER — Other Ambulatory Visit: Payer: Medicare Other

## 2021-02-24 ENCOUNTER — Ambulatory Visit (INDEPENDENT_AMBULATORY_CARE_PROVIDER_SITE_OTHER): Payer: Medicare Other | Admitting: Neurology

## 2021-02-24 ENCOUNTER — Encounter: Payer: Self-pay | Admitting: Neurology

## 2021-02-24 ENCOUNTER — Other Ambulatory Visit: Payer: Self-pay

## 2021-02-24 VITALS — BP 126/74 | HR 73 | Resp 18 | Ht 66.0 in | Wt 171.0 lb

## 2021-02-24 DIAGNOSIS — M5417 Radiculopathy, lumbosacral region: Secondary | ICD-10-CM | POA: Diagnosis not present

## 2021-02-24 NOTE — Progress Notes (Signed)
[facility] Clinic Note - Initial Visit   Date: 02/24/21  [name] MRN: [id number] DOB: Apr 24, 1951   Dear Dr. Ernie Hew:  Thank you for your kind referral of Kristin Lewis for consultation of right leg pain. Although her history is well known to you, please allow [place] to reiterate it for the purpose of our medical record. The patient was accompanied to the clinic by self.    History of Present Illness: Kristin Lewis is a 70 y.o. left-handed female with right breast cancer s/p bilateral mastectomy (2014) and depression presenting for evaluation of right leg pain.   Starting around 20 years ago, she began having achy pain over the posterior right thigh and occasionally into the posterior lower leg. These spells would come and go over the years.  For the past two years, she has daily spells of pain, lasting several hours.  Repositioning helps.  She has some numbness over the back of the thigh, nothing which radiates down her leg into the foot.  No weakness, imbalance, or falls. Her last spells was triggered by pulling the cover off her pool and falling backwards.  She denies similar problems in the left leg or muscle cramps.     Past Medical History:  Diagnosis Date   Breast cancer ([place])    "right only" (07/15/2013)   Depression    GERD (gastroesophageal reflux disease)     Past Surgical History:  Procedure Laterality Date   BLEPHAROPLASTY Bilateral    BREAST BIOPSY  06/17/2013   sterotactic/notes 06/23/2013   BREAST RECONSTRUCTION     MASTECTOMY Bilateral 07/15/2013   prophylactic left mastectomy/notes 06/23/2013   MASTECTOMY W/ SENTINEL NODE BIOPSY Bilateral 07/15/2013   Procedure: BILATERAL MASTECTOMY WITH  RIGHT SENTINEL LYMPH NODE BIOPSY;  Surgeon: Imogene Burn. [name], MD;  Location: [place] [facility];  Service: General;  Laterality: Bilateral;  RIGHT NUC MED INJECTION 10:30   SENTINEL LYMPH NODE BIOPSY Right 07/15/2013   TUBAL LIGATION     tummy tuck        Medications:  Outpatient Encounter Medications as of 02/24/2021  Medication Sig Note   Cholecalciferol 25 MCG (1000 UT) tablet Take 1,000 Units by mouth daily.    sertraline (ZOLOFT) 100 MG tablet Take 100 mg by mouth daily. 02/24/2021: Patient takes '100mg'$  bid   terbinafine (LAMISIL) 250 MG tablet Please take one a day x 7days, repeat every 4 weeks x 4 months    Facility-Administered Encounter Medications as of 02/24/2021  Medication   0.9 %  sodium chloride infusion    Allergies: No Known Allergies  Family History: Family History  Problem Relation Age of Onset   Colon cancer Neg Hx    Stomach cancer Neg Hx    Rectal cancer Neg Hx    Esophageal cancer Neg Hx    Liver cancer Neg Hx     Social History: Social History   Tobacco Use   Smoking status: Never   Smokeless tobacco: Never  Substance Use Topics   Alcohol use: Yes    Alcohol/week: 1.0 standard drink    Types: 1 Shots of liquor per week    Comment: less than 1 per month   Drug use: No   Social History   Social History Narrative   Left handed   Drinks caffeine   One story home    Vital Signs:  BP 126/74   Pulse 73   Resp 18   Ht '5\' 6"'$  (1.676 m)   Wt 171 lb (77.6 kg)  SpO2 97%   BMI 27.60 kg/m   Neurological Exam: MENTAL STATUS including orientation to time, place, person, recent and remote memory, attention span and concentration, language, and fund of knowledge is normal.  Speech is not dysarthric.  CRANIAL NERVES: II:  No visual field defects.   III-IV-VI: Pupils equal round and reactive to light.  Normal conjugate, extra-ocular eye movements in all directions of gaze.  No nystagmus.  No ptosis.   V:  Normal facial sensation.    VII:  Normal facial symmetry and movements.   VIII:  Normal hearing and vestibular function.   IX-X:  Normal palatal movement.   XI:  Normal shoulder shrug and head rotation.   XII:  Normal tongue strength and range of motion, no deviation or  fasciculation.  MOTOR:  No atrophy, fasciculations or abnormal movements.  No pronator drift.   Upper Extremity:  Right  Left  Deltoid  5/5   5/5   Biceps  5/5   5/5   Triceps  5/5   5/5   Infraspinatus 5/5  5/5  Medial pectoralis 5/5  5/5  Wrist extensors  5/5   5/5   Wrist flexors  5/5   5/5   Finger extensors  5/5   5/5   Finger flexors  5/5   5/5   Dorsal interossei  5/5   5/5   Abductor pollicis  5/5   5/5   Tone (Ashworth scale)  0  0   Lower Extremity:  Right  Left  Hip flexors  5/5   5/5   Hip extensors  5/5   5/5   Adductor 5/5  5/5  Abductor 5/5  5/5  Knee flexors  5/5   5/5   Knee extensors  5/5   5/5   Dorsiflexors  5/5   5/5   Plantarflexors  5/5   5/5   Toe extensors  5/5   5/5   Toe flexors  5/5   5/5   Tone (Ashworth scale)  0  0   MSRs:  Right        Left                  brachioradialis 2+  2+  biceps 2+  2+  triceps 2+  2+  patellar 3+  3+  ankle jerk 2+  2+  Hoffman no  no  plantar response down  down   SENSORY:  Normal and symmetric perception of light touch, pinprick, vibration, and proprioception.  Romberg's sign absent.   COORDINATION/GAIT: Normal finger-to- nose-finger.  Intact rapid alternating movements bilaterally. Gait narrow based and stable. Tandem and stressed gait intact.    IMPRESSION: Right posterior thigh pain, suggestive of lumbosacral radiculopathy/canal stenosis (brisk patella reflexes).  No sensory loss or motor deficits on exam.   - Recommend starting PT for low back strengthening and stretching  - If symptoms progress, MRI lumbar spine can be ordered  Return to clinic in 6 months.    Thank you for allowing me to participate in patient's care.  If I can answer any additional questions, I would be pleased to do so.    Sincerely,    Zarina Pe K. Posey Pronto, DO

## 2021-02-24 NOTE — Patient Instructions (Signed)
Start physical therapy for low back strengthening and right leg pain  Return to clinic in 6 months

## 2021-02-27 ENCOUNTER — Ambulatory Visit (INDEPENDENT_AMBULATORY_CARE_PROVIDER_SITE_OTHER): Payer: Medicare Other | Admitting: *Deleted

## 2021-02-27 ENCOUNTER — Other Ambulatory Visit: Payer: Self-pay

## 2021-02-27 DIAGNOSIS — B351 Tinea unguium: Secondary | ICD-10-CM

## 2021-02-27 NOTE — Progress Notes (Signed)
Patient presents today for the 3rd laser treatment. Diagnosed with mycotic nail infection by Dr. Paulla Dolly.   Toenail most affected 3rd right. Her nails have completely grown out and look healthy.  All other systems are negative.  No filing needed today. Laser therapy was administered to 1-5 bilateral toenails and patient tolerated the treatment well. All safety precautions were in place.   Patient has completed lamisil therapy.   Since this is the 2nd round of laser treatment she has had and she has concerns of it returning, I recommended laser maintenance every 3 months.   Follow up in 3 months for maintenance on all ten nails.  Picture of nails taken today to document visual progress

## 2021-03-07 DIAGNOSIS — G4700 Insomnia, unspecified: Secondary | ICD-10-CM | POA: Diagnosis not present

## 2021-03-07 DIAGNOSIS — E559 Vitamin D deficiency, unspecified: Secondary | ICD-10-CM | POA: Diagnosis not present

## 2021-03-07 DIAGNOSIS — R7303 Prediabetes: Secondary | ICD-10-CM | POA: Diagnosis not present

## 2021-03-07 DIAGNOSIS — F411 Generalized anxiety disorder: Secondary | ICD-10-CM | POA: Diagnosis not present

## 2021-03-07 DIAGNOSIS — F331 Major depressive disorder, recurrent, moderate: Secondary | ICD-10-CM | POA: Diagnosis not present

## 2021-03-07 DIAGNOSIS — K219 Gastro-esophageal reflux disease without esophagitis: Secondary | ICD-10-CM | POA: Diagnosis not present

## 2021-03-07 DIAGNOSIS — E782 Mixed hyperlipidemia: Secondary | ICD-10-CM | POA: Diagnosis not present

## 2021-03-08 DIAGNOSIS — H35371 Puckering of macula, right eye: Secondary | ICD-10-CM | POA: Diagnosis not present

## 2021-03-08 DIAGNOSIS — H25813 Combined forms of age-related cataract, bilateral: Secondary | ICD-10-CM | POA: Diagnosis not present

## 2021-03-08 DIAGNOSIS — Z85828 Personal history of other malignant neoplasm of skin: Secondary | ICD-10-CM | POA: Diagnosis not present

## 2021-03-08 DIAGNOSIS — Z08 Encounter for follow-up examination after completed treatment for malignant neoplasm: Secondary | ICD-10-CM | POA: Diagnosis not present

## 2021-03-16 ENCOUNTER — Ambulatory Visit: Payer: Medicare Other | Attending: Neurology

## 2021-03-16 ENCOUNTER — Other Ambulatory Visit: Payer: Self-pay

## 2021-03-16 DIAGNOSIS — M25651 Stiffness of right hip, not elsewhere classified: Secondary | ICD-10-CM | POA: Insufficient documentation

## 2021-03-16 DIAGNOSIS — M79604 Pain in right leg: Secondary | ICD-10-CM | POA: Insufficient documentation

## 2021-03-16 NOTE — Therapy (Signed)
[place], [place], [address] Phone: [phone]   Fax:  [phone]  Physical Therapy Evaluation  Patient Details  Name: Kristin Lewis MRN: [id number] Date of Birth: 29-Oct-1950 Referring Provider (PT): Alda Berthold, DO   Encounter Date: 03/16/2021   PT End of Session - 03/16/21 1320     Visit Number 1    Number of Visits 5    Date for PT Re-Evaluation 04/15/21    Authorization Type MCR    Progress Note Due on Visit 10    PT Start Time 1320    PT Stop Time 1405    PT Time Calculation (min) 45 min    Activity Tolerance Patient tolerated treatment well    Behavior During Therapy [facility] for tasks assessed/performed             Past Medical History:  Diagnosis Date   Breast cancer ([place])    "right only" (07/15/2013)   Depression    GERD (gastroesophageal reflux disease)     Past Surgical History:  Procedure Laterality Date   BLEPHAROPLASTY Bilateral    BREAST BIOPSY  06/17/2013   sterotactic/notes 06/23/2013   BREAST RECONSTRUCTION     MASTECTOMY Bilateral 07/15/2013   prophylactic left mastectomy/notes 06/23/2013   MASTECTOMY W/ SENTINEL NODE BIOPSY Bilateral 07/15/2013   Procedure: BILATERAL MASTECTOMY WITH  RIGHT SENTINEL LYMPH NODE BIOPSY;  Surgeon: Imogene Burn. [name], MD;  Location: [place];  Service: General;  Laterality: Bilateral;  RIGHT NUC MED INJECTION 10:30   SENTINEL LYMPH NODE BIOPSY Right 07/15/2013   TUBAL LIGATION     tummy tuck      There were no vitals filed for this visit.    Subjective Assessment - 03/16/21 1321     Subjective "I have a  numbness and ache in my Rt leg and the doctors have not been able to find anything wrong with it. The neurologist said to come to PT and maybe work on my core and perhaps that will help." She reports a couple years ago she was pulling a heavy pool cover off the pool and she fell back onto the Rt side and this flared up her pain, though  reports this pain has been intermittent for years. She reports the pain is intermittent without known cause and is mostly just an annoyance. She feels the only aspect of worsening is that the pain is further down her leg, but the intensity/duration of her pain is not worsening. The pain is along the lateral aspect of the Rt thigh to the lateral lower leg that feels superficial described as numbness/prickling. She reports the numbness is occuring daily about 10 times on average that last for about 10 minutes at worst 5/10. No red flag symptoms. She reports occasional low back pain with heavy household chores.    Pertinent History history of breast cancer    How long can you sit comfortably? sitting doesn't bother it    How long can you stand comfortably? no issues    How long can you walk comfortably? no issues    Patient Stated Goals All I want is if there is any kind of exercise I can do that will help this indirectly.    Currently in Pain? Yes    Pain Score 2     Pain Location Leg    Pain Orientation Right    Pain Descriptors / Indicators Numbness    Pain Type Chronic pain    Pain Radiating  Towards left lateral lower leg    Pain Onset More than a month ago    Pain Frequency Intermittent    Aggravating Factors  unsure    Pain Relieving Factors repositioned                [facility] PT Assessment - 03/16/21 0001       Assessment   Medical Diagnosis M54.17 (ICD-10-CM) - Lumbosacral radiculopathy    Referring Provider (PT) Alda Berthold, DO    Onset Date/Surgical Date --   >1 year   Hand Dominance Left    Next MD Visit January 2023    Prior Therapy no      Precautions   Precautions None      Restrictions   Weight Bearing Restrictions No      Balance Screen   Has the patient fallen in the past 6 months No      [place] residence    Living Arrangements Other (Comment)    Additional Comments stairs to enter      Prior Function   Level of  Independence Independent    Vocation Retired    Leisure travel, reading, walking      Cognition   Overall Cognitive Status Within Functional Limits for tasks assessed      Observation/Other Assessments   Focus on Therapeutic Outcomes (FOTO)  94% function 86% predicted      Sensation   Light Touch Appears Intact      Coordination   Gross Motor Movements are Fluid and Coordinated Yes      Posture/Postural Control   Posture/Postural Control Postural limitations    Postural Limitations Decreased lumbar lordosis      AROM   Overall AROM Comments Lumbar AROM WNL all planes and pain free      Strength   Overall Strength Comments ankle strength 5/5    Right Hip Flexion 5/5    Right Hip Extension 4+/5    Right Hip ABduction 4+/5    Left Hip Flexion 5/5    Left Hip Extension 4+/5    Left Hip ABduction 4+/5    Right Knee Flexion 5/5    Right Knee Extension 5/5    Left Knee Flexion 5/5    Left Knee Extension 5/5      Flexibility   Hamstrings WNL    Quadriceps WNL    Piriformis 'WNL      Palpation   Spinal mobility hypomobile L-spine    Palpation comment hip anterior glides reproduce lower leg pain, inferior hip mobilization reduced numbness, normal posterior hip mobility; no palpable tenderness about the lumbar or hip region      Special Tests   Other special tests (+) SLR Rt; FABER elicits pain in the lateral lower leg (+) Ober's                        Objective measurements completed on examination: See above findings.       [place] Adult PT Treatment/Exercise - 03/16/21 0001       Self-Care   Self-Care Other Self-Care Comments    Other Self-Care Comments  see patient education                    PT Education - 03/16/21 1438     Education Details Education on lumbar/hip anatomy, education on assessment findings, POC, and HEP.    Person(s) Educated Patient    Methods  Explanation;Demonstration;Verbal cues;Handout;Tactile cues     Comprehension Verbalized understanding;Returned demonstration;Verbal cues required;Tactile cues required              PT Short Term Goals - 03/16/21 1340       PT SHORT TERM GOAL #1   Title Patient will be independent with initial HEP.    Baseline Issued at eval.    Time 2    Period Weeks    Status New    Target Date 03/30/21      PT SHORT TERM GOAL #2   Title Therapist will review FOTO and anticipated functional progress.    Baseline FOTO captured    Time 1    Period Weeks    Status New               PT Long Term Goals - 03/16/21 1414       PT LONG TERM GOAL #1   Title Patient will have no reproduction of lower leg pain with FABER and hip mobilizations indicative of improvements in her overall condition.    Baseline reproduction of lower leg pain with both    Time 4    Period Weeks    Status New    Target Date 04/13/21      PT LONG TERM GOAL #2   Title Patient will report numbness at worst as 2/10 and a reduction in frequency of daily numbness to less than 5 times a day to improve her overall quality of life.    Baseline at worst 5/10, happens on average 10 times daily.    Time 4    Period Weeks    Status New    Target Date 04/13/21      PT LONG TERM GOAL #3   Title Patient will be independent in advanced home program in order to manage her chronic condition.    Baseline n/a    Time 4    Period Weeks    Status New    Target Date 04/13/21                    Plan - 03/16/21 1434     Clinical Impression Statement Patient is a 70 y/o female who presents to OPPT with chief complaint of intermittent chronic Rt lateral thigh and lower leg numbness that has been ongoing for multiple years, though most recent exacerbation was from pulling her pool cover and falling onto the Rt side 2 years ago. Upon assessment she is noted to have full and pain free lumbar AROM and good BLE strength. She is noted to have hypomobility about the lubar spine,  a positive  SLR on the RLE, as well as reproduction of her lower leg numbness with FABER and anterior hip mobilizations on the Rt. She will benefit from skilled PT to address her mobility deficits in the spine and hip to assist in overall pain reduction and optimize her function.    Personal Factors and Comorbidities Age;Time since onset of injury/illness/exacerbation    Stability/Clinical Decision Making Evolving/Moderate complexity    Clinical Decision Making Moderate    Rehab Potential Fair    PT Frequency 1x / week    PT Duration 4 weeks    PT Treatment/Interventions ADLs/Self Care Home Management;Cryotherapy;Moist Heat;Traction;Therapeutic activities;Therapeutic exercise;Neuromuscular re-education;Patient/family education;Manual techniques;Passive range of motion;Dry needling;Taping;Spinal Manipulations    PT Next Visit Plan hip and spine mobility, review FOTO, review HEP    PT Home Exercise Plan Access Code: [email or web address]    Consulted  and Agree with Plan of Care Patient             Patient will benefit from skilled therapeutic intervention in order to improve the following deficits and impairments:  Hypomobility, Impaired flexibility, Postural dysfunction, Pain, Decreased strength  Visit Diagnosis: Pain in right leg  Stiffness of right hip, not elsewhere classified     Problem List Patient Active Problem List   Diagnosis Date Noted   Breast cancer of upper-outer quadrant of right female breast ([place]) 07/28/2013   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 [place], [place], [address] Phone: [phone]   Fax:  [phone]  Name: Kiarra Sendelbach MRN: [id number] Date of Birth: 05/06/1951

## 2021-03-22 ENCOUNTER — Ambulatory Visit: Payer: Medicare Other

## 2021-03-22 ENCOUNTER — Other Ambulatory Visit: Payer: Self-pay

## 2021-03-22 DIAGNOSIS — M25651 Stiffness of right hip, not elsewhere classified: Secondary | ICD-10-CM

## 2021-03-22 DIAGNOSIS — M79604 Pain in right leg: Secondary | ICD-10-CM | POA: Diagnosis not present

## 2021-03-22 NOTE — Therapy (Signed)
[place], [place], [address] Phone: [phone]   Fax:  [phone]  Physical Therapy Treatment  Patient Details  Name: Kristin Lewis MRN: [id number] Date of Birth: Nov 24, 1950 Referring Provider (PT): Alda Berthold, DO   Encounter Date: 03/22/2021   PT End of Session - 03/22/21 1615     Visit Number 2    Number of Visits 5    Date for PT Re-Evaluation 04/15/21    Authorization Type MCR    Progress Note Due on Visit 10    PT Start Time [id number]    PT Stop Time 1657    PT Time Calculation (min) 40 min    Activity Tolerance Patient tolerated treatment well    Behavior During Therapy [facility] for tasks assessed/performed             Past Medical History:  Diagnosis Date   Breast cancer ([place])    "right only" (07/15/2013)   Depression    GERD (gastroesophageal reflux disease)     Past Surgical History:  Procedure Laterality Date   BLEPHAROPLASTY Bilateral    BREAST BIOPSY  06/17/2013   sterotactic/notes 06/23/2013   BREAST RECONSTRUCTION     MASTECTOMY Bilateral 07/15/2013   prophylactic left mastectomy/notes 06/23/2013   MASTECTOMY W/ SENTINEL NODE BIOPSY Bilateral 07/15/2013   Procedure: BILATERAL MASTECTOMY WITH  RIGHT SENTINEL LYMPH NODE BIOPSY;  Surgeon: Imogene Burn. [name], MD;  Location: [place];  Service: General;  Laterality: Bilateral;  RIGHT NUC MED INJECTION 10:30   SENTINEL LYMPH NODE BIOPSY Right 07/15/2013   TUBAL LIGATION     tummy tuck      There were no vitals filed for this visit.   Subjective Assessment - 03/22/21 1616     Subjective She reports things are going ok. She feels she is not feeling the ache/numbness as much meaning she is not as conscious of it.    Pertinent History history of breast cancer    How long can you sit comfortably? sitting doesn't bother it    How long can you stand comfortably? no issues    How long can you walk comfortably? no issues    Patient  Stated Goals All I want is if there is any kind of exercise I can do that will help this indirectly.    Currently in Pain? No/denies    Pain Onset --                [facility] PT Assessment - 03/22/21 0001       Palpation   Palpation comment Rt anterior rotated innominate             [facility] Adult PT Treatment/Exercise:  Therapeutic Exercise: to improve strength and flexibility  - Sciatic nerve glides Rt 1 x 10  - Piriformis stretch 30 sec each -LTR 5 reps Supine hip flexor stretch Rt 60 sec  Prone pressup on elbows 1 x 10   Manual Therapy: to improve soft tissue/joint mobility - LAD Rt 1 min Inferior and anterior hip mobilizations Rt L1-L5 CPAs grade II-III Muscle energy technique to improve pelvic symmetry  STM to posterior glutes and piriformis Rt   Neuromuscular re-ed: - N/A  Therapeutic Activity: - N/A  Self-care/Home Management: - See patient education                        PT Education - 03/22/21 1627     Education Details Reviewed FOTO and  anticipated progress. Updated HEP.    Person(s) Educated Patient    Methods Explanation;Demonstration;Verbal cues;Handout    Comprehension Verbalized understanding;Returned demonstration;Verbal cues required              PT Short Term Goals - 03/22/21 1631       PT SHORT TERM GOAL #1   Title Patient will be independent with initial HEP.    Baseline cues required    Time 2    Period Weeks    Status On-going    Target Date 03/30/21      PT SHORT TERM GOAL #2   Title Therapist will review FOTO and anticipated functional progress.    Baseline FOTO captured; reviewed 8/17    Time 1    Period Weeks    Status Achieved               PT Long Term Goals - 03/16/21 1414       PT LONG TERM GOAL #1   Title Patient will have no reproduction of lower leg pain with FABER and hip mobilizations indicative of improvements in her overall condition.    Baseline reproduction of lower leg pain  with both    Time 4    Period Weeks    Status New    Target Date 04/13/21      PT LONG TERM GOAL #2   Title Patient will report numbness at worst as 2/10 and a reduction in frequency of daily numbness to less than 5 times a day to improve her overall quality of life.    Baseline at worst 5/10, happens on average 10 times daily.    Time 4    Period Weeks    Status New    Target Date 04/13/21      PT LONG TERM GOAL #3   Title Patient will be independent in advanced home program in order to manage her chronic condition.    Baseline n/a    Time 4    Period Weeks    Status New    Target Date 04/13/21                   Plan - 03/22/21 1622     Clinical Impression Statement Patient reports she has been less aware of her Rt leg pain since initial evaluation. Reviewed HEP with patient requiring cues for setup of figure 4 stretch and sciatic nerve glides. Manual therapy focused on improving hip and spinal mobility with no reproduction of pain experienced with hip or spinal mobilizations today.She is noted to have slight Rt anterior rotated innominate that easily corrects with muscle energy technique. She tolerated session well today without reports of pain or parasthesia.    Personal Factors and Comorbidities Age;Time since onset of injury/illness/exacerbation    Stability/Clinical Decision Making Evolving/Moderate complexity    PT Treatment/Interventions ADLs/Self Care Home Management;Cryotherapy;Moist Heat;Traction;Therapeutic activities;Therapeutic exercise;Neuromuscular re-education;Patient/family education;Manual techniques;Passive range of motion;Dry needling;Taping;Spinal Manipulations    PT Next Visit Plan hip and spine mobility, check hip levels.    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 [place], [place], [address] Phone: [phone]   Fax:  [phone]  Name: Vikki Bisogno MRN: [id number] Date of Birth: 08/09/1950

## 2021-03-30 ENCOUNTER — Other Ambulatory Visit: Payer: Self-pay

## 2021-03-30 ENCOUNTER — Ambulatory Visit: Payer: Medicare Other

## 2021-03-30 DIAGNOSIS — M25651 Stiffness of right hip, not elsewhere classified: Secondary | ICD-10-CM | POA: Diagnosis not present

## 2021-03-30 DIAGNOSIS — M79604 Pain in right leg: Secondary | ICD-10-CM | POA: Diagnosis not present

## 2021-03-30 NOTE — Therapy (Signed)
[place], [place], [address] Phone: [phone]   Fax:  [phone]  Physical Therapy Treatment  Patient Details  Name: Kristin Lewis MRN: [id number] Date of Birth: 1951/02/18 Referring Provider (PT): Alda Berthold, DO   Encounter Date: 03/30/2021   PT End of Session - 03/30/21 1315     Visit Number 3    Number of Visits 5    Date for PT Re-Evaluation 04/15/21    Authorization Type MCR    Progress Note Due on Visit 10    PT Start Time [id number]    PT Stop Time 1356    PT Time Calculation (min) 41 min    Activity Tolerance Patient tolerated treatment well    Behavior During Therapy [facility] for tasks assessed/performed             Past Medical History:  Diagnosis Date   Breast cancer ([place])    "right only" (07/15/2013)   Depression    GERD (gastroesophageal reflux disease)     Past Surgical History:  Procedure Laterality Date   BLEPHAROPLASTY Bilateral    BREAST BIOPSY  06/17/2013   sterotactic/notes 06/23/2013   BREAST RECONSTRUCTION     MASTECTOMY Bilateral 07/15/2013   prophylactic left mastectomy/notes 06/23/2013   MASTECTOMY W/ SENTINEL NODE BIOPSY Bilateral 07/15/2013   Procedure: BILATERAL MASTECTOMY WITH  RIGHT SENTINEL LYMPH NODE BIOPSY;  Surgeon: Imogene Burn. [name], MD;  Location: [place];  Service: General;  Laterality: Bilateral;  RIGHT NUC MED INJECTION 10:30   SENTINEL LYMPH NODE BIOPSY Right 07/15/2013   TUBAL LIGATION     tummy tuck      There were no vitals filed for this visit.   Subjective Assessment - 03/30/21 1316     Subjective Patient reports she is feeling very good. She feels that the numbness/ache is a little less. She is not noticing the numbness/ache as often and is not as pronounced when she does notice it.    Pertinent History history of breast cancer    How long can you sit comfortably? sitting doesn't bother it    How long can you stand comfortably? no issues     How long can you walk comfortably? no issues    Patient Stated Goals All I want is if there is any kind of exercise I can do that will help this indirectly.    Currently in Pain? Yes    Pain Score 2     Pain Location --   thigh   Pain Orientation Right;Lateral    Pain Descriptors / Indicators Numbness    Pain Type Chronic pain    Pain Onset More than a month ago    Pain Frequency Intermittent                [facility] PT Assessment - 03/30/21 0001       Palpation   Palpation comment pelvic symmetry               [facility] Adult PT Treatment/Exercise:   Therapeutic Exercise: to improve strength and flexibility  -LTR with figure 4 x 60 sec each  Prone pressup on elbows 1 x 10  Cat/cow 2 x 10  IT band stretch 2 x 30 sec RLE Supine butterfly stretch 2x30 sec  Double knee chest x 10  Standing lumbar extension 1 x 10   Not today:  Figure 4 stretch  Hip flexor stretch    Manual Therapy: to improve soft tissue/joint mobility -  LAD Rt 1 min Inferior,posterior, and anterior hip mobilizations Rt L1-L5 CPAs grade II-III - mobilization with movement Rt hip IR/ER  STM to posterior glutes and piriformis Rt    Neuromuscular re-ed: - N/A   Therapeutic Activity: - N/A   Self-care/Home Management: - See patient education                       PT Education - 03/30/21 1357     Education Details Updated HEP.    Person(s) Educated Patient    Methods Explanation;Demonstration;Verbal cues;Handout    Comprehension Verbalized understanding;Returned demonstration;Verbal cues required              PT Short Term Goals - 03/30/21 1333       PT SHORT TERM GOAL #1   Title Patient will be independent with initial HEP.    Baseline cues required    Time 2    Period Weeks    Status Achieved    Target Date 03/30/21      PT SHORT TERM GOAL #2   Title Therapist will review FOTO and anticipated functional progress.    Baseline FOTO captured; reviewed 8/17     Time 1    Period Weeks    Status Achieved               PT Long Term Goals - 03/16/21 1414       PT LONG TERM GOAL #1   Title Patient will have no reproduction of lower leg pain with FABER and hip mobilizations indicative of improvements in her overall condition.    Baseline reproduction of lower leg pain with both    Time 4    Period Weeks    Status New    Target Date 04/13/21      PT LONG TERM GOAL #2   Title Patient will report numbness at worst as 2/10 and a reduction in frequency of daily numbness to less than 5 times a day to improve her overall quality of life.    Baseline at worst 5/10, happens on average 10 times daily.    Time 4    Period Weeks    Status New    Target Date 04/13/21      PT LONG TERM GOAL #3   Title Patient will be independent in advanced home program in order to manage her chronic condition.    Baseline n/a    Time 4    Period Weeks    Status New    Target Date 04/13/21                   Plan - 03/30/21 1334     Clinical Impression Statement Patient arrives with symmetical pelvic alignment. Able to abolish her lateral thigh numbness with manual therapy at beginning of session. She continues to have anterior and inferior hip joint hypomobility. Able to further progress hip and lumbar mobility reporting a catching sensation on the Rt side of the low back with double knees to chest, otherwise no complaints.    Personal Factors and Comorbidities Age;Time since onset of injury/illness/exacerbation    Stability/Clinical Decision Making Evolving/Moderate complexity    PT Treatment/Interventions ADLs/Self Care Home Management;Cryotherapy;Moist Heat;Traction;Therapeutic activities;Therapeutic exercise;Neuromuscular re-education;Patient/family education;Manual techniques;Passive range of motion;Dry needling;Taping;Spinal Manipulations    PT Next Visit Plan hip and spine mobility, check hip levels.    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

## 2021-04-04 ENCOUNTER — Ambulatory Visit: Payer: Medicare Other

## 2021-04-04 ENCOUNTER — Other Ambulatory Visit: Payer: Self-pay

## 2021-04-04 DIAGNOSIS — M25651 Stiffness of right hip, not elsewhere classified: Secondary | ICD-10-CM | POA: Diagnosis not present

## 2021-04-04 DIAGNOSIS — M79604 Pain in right leg: Secondary | ICD-10-CM | POA: Diagnosis not present

## 2021-04-04 NOTE — Therapy (Signed)
[place], [place], [address] Phone: [phone]   Fax:  [phone]  Physical Therapy Treatment  Patient Details  Name: Kristin Lewis MRN: [id number] Date of Birth: September 27, 1950 Referring Provider (PT): Alda Berthold, DO   Encounter Date: 04/04/2021   PT End of Session - 04/04/21 1315     Visit Number 4    Number of Visits 5    Date for PT Re-Evaluation 04/15/21    Authorization Type MCR    Progress Note Due on Visit 10    PT Start Time 0347    PT Stop Time 4259    PT Time Calculation (min) 40 min    Activity Tolerance Patient tolerated treatment well    Behavior During Therapy [facility] for tasks assessed/performed             Past Medical History:  Diagnosis Date   Breast cancer ([place])    "right only" (07/15/2013)   Depression    GERD (gastroesophageal reflux disease)     Past Surgical History:  Procedure Laterality Date   BLEPHAROPLASTY Bilateral    BREAST BIOPSY  06/17/2013   sterotactic/notes 06/23/2013   BREAST RECONSTRUCTION     MASTECTOMY Bilateral 07/15/2013   prophylactic left mastectomy/notes 06/23/2013   MASTECTOMY W/ SENTINEL NODE BIOPSY Bilateral 07/15/2013   Procedure: BILATERAL MASTECTOMY WITH  RIGHT SENTINEL LYMPH NODE BIOPSY;  Surgeon: Imogene Burn. [name], MD;  Location: [place];  Service: General;  Laterality: Bilateral;  RIGHT NUC MED INJECTION 10:30   SENTINEL LYMPH NODE BIOPSY Right 07/15/2013   TUBAL LIGATION     tummy tuck      There were no vitals filed for this visit.   Subjective Assessment - 04/04/21 1316     Subjective Patient reports she is doing well. No numbness/ache currently. She has not had as much trouble recently getting comfortable in bed.    Pertinent History history of breast cancer    How long can you sit comfortably? sitting doesn't bother it    How long can you stand comfortably? no issues    How long can you walk comfortably? no issues    Patient  Stated Goals All I want is if there is any kind of exercise I can do that will help this indirectly.    Currently in Pain? No/denies                [facility] PT Assessment - 04/04/21 0001       Palpation   Palpation comment no pain with hip mobilizations      Special Tests   Other special tests no pain with FABER on the Rt                 [facility] Adult PT Treatment/Exercise:   Therapeutic Exercise: to improve strength and flexibility   -LTR with figure 4 x 60 sec each  - NuStep level 6 UE/LE 5 min  Prone pressup on elbows 1 x 10  - Prone hip extension 2 x 10 bilateral  Clamshell 2  x 10 bilateral    Not today:  Supine butterfly stretch 2x30 sec  Cat/cow 2 x 10  IT band stretch 2 x 30 sec RLE Figure 4 stretch  Hip flexor stretch   Standing lumbar extension 1 x 10  Double knee chest x 10   Manual Therapy: to improve soft tissue/joint mobility - LAD Rt 1 min Inferior,posterior, and anterior hip mobilizations Rt - mobilization with movement  Rt hip IR/ER  STM to posterior glutes and piriformis Rt and lumbar paraspinals   Not today: L1-L5 CPAs grade II-III   Neuromuscular re-ed: - N/A   Therapeutic Activity: - N/A   Self-care/Home Management: - See patient education                     PT Education - 04/04/21 1346     Education Details Updated HEP; where to purchase stretch strap    Person(s) Educated Patient    Methods Explanation;Verbal cues;Handout    Comprehension Verbalized understanding;Returned demonstration;Verbal cues required              PT Short Term Goals - 03/30/21 1333       PT SHORT TERM GOAL #1   Title Patient will be independent with initial HEP.    Baseline cues required    Time 2    Period Weeks    Status Achieved    Target Date 03/30/21      PT SHORT TERM GOAL #2   Title Therapist will review FOTO and anticipated functional progress.    Baseline FOTO captured; reviewed 8/17    Time 1    Period Weeks     Status Achieved               PT Long Term Goals - 04/04/21 1340       PT LONG TERM GOAL #1   Title Patient will have no reproduction of lower leg pain with FABER and hip mobilizations indicative of improvements in her overall condition.    Baseline reproduction of lower leg pain with both    Time 4    Period Weeks    Status Achieved      PT LONG TERM GOAL #2   Title Patient will report numbness at worst as 2/10 and a reduction in frequency of daily numbness to less than 5 times a day to improve her overall quality of life.    Baseline at worst 5/10, happens on average 10 times daily.    Time 4    Period Weeks    Status On-going      PT LONG TERM GOAL #3   Title Patient will be independent in advanced home program in order to manage her chronic condition.    Baseline n/a    Time 4    Period Weeks    Status On-going                   Plan - 04/04/21 1341     Clinical Impression Statement Patient tolerated session well today without reports of pain or parasthesia. She has no reproduction of symptoms with FABER or hip mobilizations today having met this long term functional goal. Hypomobiltty remains in the anterior direction of the right hip. Able to initiate hip strengthening with patient requiring cues to limit compensatory trunk engagement.    Personal Factors and Comorbidities Age;Time since onset of injury/illness/exacerbation    Stability/Clinical Decision Making Evolving/Moderate complexity    PT Treatment/Interventions ADLs/Self Care Home Management;Cryotherapy;Moist Heat;Traction;Therapeutic activities;Therapeutic exercise;Neuromuscular re-education;Patient/family education;Manual techniques;Passive range of motion;Dry needling;Taping;Spinal Manipulations    PT Next Visit Plan hip and spine mobility, check hip levels. 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

## 2021-04-13 ENCOUNTER — Other Ambulatory Visit: Payer: Self-pay

## 2021-04-13 ENCOUNTER — Ambulatory Visit: Payer: Medicare Other | Attending: Neurology

## 2021-04-13 DIAGNOSIS — M79604 Pain in right leg: Secondary | ICD-10-CM | POA: Diagnosis not present

## 2021-04-13 DIAGNOSIS — M25651 Stiffness of right hip, not elsewhere classified: Secondary | ICD-10-CM | POA: Diagnosis not present

## 2021-04-13 NOTE — Therapy (Signed)
[place], [place], [address] Phone: [phone]   Fax:  [phone]  Physical Therapy Treatment/Discharge   Patient Details  Name: Kristin Lewis MRN: [id number] Date of Birth: 07/04/1951 Referring Provider (PT): Alda Berthold, DO   Encounter Date: 04/13/2021   PT End of Session - 04/13/21 1321     Visit Number 5    Number of Visits 5    Date for PT Re-Evaluation 04/15/21    Authorization Type MCR    Progress Note Due on Visit 10    PT Start Time 3009    PT Stop Time 1350    PT Time Calculation (min) 29 min    Activity Tolerance Patient tolerated treatment well    Behavior During Therapy [facility] for tasks assessed/performed             Past Medical History:  Diagnosis Date   Breast cancer ([place])    "right only" (07/15/2013)   Depression    GERD (gastroesophageal reflux disease)     Past Surgical History:  Procedure Laterality Date   BLEPHAROPLASTY Bilateral    BREAST BIOPSY  06/17/2013   sterotactic/notes 06/23/2013   BREAST RECONSTRUCTION     MASTECTOMY Bilateral 07/15/2013   prophylactic left mastectomy/notes 06/23/2013   MASTECTOMY W/ SENTINEL NODE BIOPSY Bilateral 07/15/2013   Procedure: BILATERAL MASTECTOMY WITH  RIGHT SENTINEL LYMPH NODE BIOPSY;  Surgeon: Imogene Burn. [name], MD;  Location: [place];  Service: General;  Laterality: Bilateral;  RIGHT NUC MED INJECTION 10:30   SENTINEL LYMPH NODE BIOPSY Right 07/15/2013   TUBAL LIGATION     tummy tuck      There were no vitals filed for this visit.   Subjective Assessment - 04/13/21 1322     Subjective Patient reports she has been feeling good. She reports the numbness at worst is 3/10 and happens maybe 3 times a day. She feels that she is ready for discharge and can continue with the exercises which are giving her positive results.    Pertinent History history of breast cancer    How long can you sit comfortably? sitting doesn't bother  it    How long can you stand comfortably? no issues    How long can you walk comfortably? no issues    Patient Stated Goals All I want is if there is any kind of exercise I can do that will help this indirectly.    Currently in Pain? No/denies                [facility] PT Assessment - 04/13/21 0001       Observation/Other Assessments   Focus on Therapeutic Outcomes (FOTO)  84%      AROM   Overall AROM Comments Lumbar AROM WNL all planes and pain free      Strength   Right Hip Flexion 5/5    Right Hip Extension 5/5    Right Hip ABduction 5/5    Left Hip Flexion 5/5    Left Hip Extension 5/5    Left Hip ABduction 5/5      Palpation   Spinal mobility L-spine WNL    Palpation comment no pain with hip mobilizations      Special Tests   Other special tests (-) SLR (-) FABER (-) FADIR (-) Ober's                     [facility] Adult PT Treatment/Exercise:   Therapeutic Exercise: to  improve strength and flexibility   Clamshells 2 x 10 green band bilaterally Hip bridge 2 x 10  Figure 4 stretch 30 sec   Not today:    -LTR with figure 4 x 60 sec each  - NuStep level 6 UE/LE 5 min  Prone pressup on elbows 1 x 10  - Prone hip extension 2 x 10 bilateral  Supine butterfly stretch 2x30 sec  Cat/cow 2 x 10  IT band stretch 2 x 30 sec RLE Hip flexor stretch   Standing lumbar extension 1 x 10  Double knee chest x 10     Self-care/Home Management: - Education on re-assessment findings, FOTO score, D/C education, Reviewed and updated HEP.                         PT Education - 04/13/21 1355     Education Details See self care    Person(s) Educated Patient    Methods Explanation;Verbal cues;Handout    Comprehension Verbalized understanding;Returned demonstration              PT Short Term Goals - 03/30/21 1333       PT SHORT TERM GOAL #1   Title Patient will be independent with initial HEP.    Baseline cues required    Time 2    Period  Weeks    Status Achieved    Target Date 03/30/21      PT SHORT TERM GOAL #2   Title Therapist will review FOTO and anticipated functional progress.    Baseline FOTO captured; reviewed 8/17    Time 1    Period Weeks    Status Achieved               PT Long Term Goals - 04/13/21 1326       PT LONG TERM GOAL #1   Title Patient will have no reproduction of lower leg pain with FABER and hip mobilizations indicative of improvements in her overall condition.    Baseline reproduction of lower leg pain with both    Time 4    Period Weeks    Status Achieved      PT LONG TERM GOAL #2   Title Patient will report numbness at worst as 2/10 and a reduction in frequency of daily numbness to less than 5 times a day to improve her overall quality of life.    Baseline at worst 5/10, happens on average 10 times daily.; at worst 3/10 happens on average 3 times a day 04/13/21    Time 4    Period Weeks    Status Partially Met      PT LONG TERM GOAL #3   Title Patient will be independent in advanced home program in order to manage her chronic condition.    Baseline n/a    Time 4    Period Weeks    Status Achieved                   Plan - 04/13/21 1332     Clinical Impression Statement Charlea has made excellent progress in PT reporting a reduction in frequency and intensity of her RLE pain since the start of care. She demonstrates full and pain free lumbar AROM, full hip strength, normalized lumbar spine mobility, pain free Rt hip joint mobility, and no reproduction of pain with lumbar and hip special tests. She is pleased with her overall progress and feels that she can continue  with her advanced home program independently that is catered towards maintaining her trunk/hip mobility and hip strength. She is therefore appropriate for discharge at this time.    Personal Factors and Comorbidities --    Stability/Clinical Decision Making --    PT Treatment/Interventions ADLs/Self Care Home  Management;Cryotherapy;Moist Heat;Traction;Therapeutic activities;Therapeutic exercise;Neuromuscular re-education;Patient/family education;Manual techniques;Passive range of motion;Dry needling;Taping;Spinal Manipulations    PT Next Visit Plan --    PT Home Exercise Plan Access Code: [email or web address]    Consulted and Agree with Plan of Care Patient             Patient will benefit from skilled therapeutic intervention in order to improve the following deficits and impairments:  Hypomobility, Impaired flexibility, Postural dysfunction, Pain, Decreased strength  Visit Diagnosis: Pain in right leg  Stiffness of right hip, not elsewhere classified     Problem List Patient Active Problem List   Diagnosis Date Noted   Breast cancer of upper-outer quadrant of right female breast ([place]) 07/28/2013   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 Patient goals were partially met. Patient is being discharged due to being pleased with the current functional level. Gwendolyn Grant, PT, DPT, ATC 04/13/21 1:57 PM   [facility] [address] [place], [place], [address] Phone: [phone]   Fax:  [phone]  Name: Rosina Cressler MRN: [id number] Date of Birth: 1951/01/04

## 2021-04-18 DIAGNOSIS — K219 Gastro-esophageal reflux disease without esophagitis: Secondary | ICD-10-CM | POA: Diagnosis not present

## 2021-04-18 DIAGNOSIS — M79606 Pain in leg, unspecified: Secondary | ICD-10-CM | POA: Diagnosis not present

## 2021-04-18 DIAGNOSIS — Z1331 Encounter for screening for depression: Secondary | ICD-10-CM | POA: Diagnosis not present

## 2021-04-18 DIAGNOSIS — Z0000 Encounter for general adult medical examination without abnormal findings: Secondary | ICD-10-CM | POA: Diagnosis not present

## 2021-04-18 DIAGNOSIS — Z1339 Encounter for screening examination for other mental health and behavioral disorders: Secondary | ICD-10-CM | POA: Diagnosis not present

## 2021-05-18 DIAGNOSIS — Z23 Encounter for immunization: Secondary | ICD-10-CM | POA: Diagnosis not present

## 2021-06-02 ENCOUNTER — Other Ambulatory Visit: Payer: Medicare Other

## 2021-06-16 ENCOUNTER — Other Ambulatory Visit: Payer: Medicare Other

## 2021-08-15 DIAGNOSIS — H35371 Puckering of macula, right eye: Secondary | ICD-10-CM | POA: Diagnosis not present

## 2021-08-15 DIAGNOSIS — H25813 Combined forms of age-related cataract, bilateral: Secondary | ICD-10-CM | POA: Diagnosis not present

## 2021-08-28 ENCOUNTER — Ambulatory Visit (INDEPENDENT_AMBULATORY_CARE_PROVIDER_SITE_OTHER): Payer: Medicare Other | Admitting: Neurology

## 2021-08-28 ENCOUNTER — Other Ambulatory Visit: Payer: Self-pay

## 2021-08-28 ENCOUNTER — Encounter: Payer: Self-pay | Admitting: Neurology

## 2021-08-28 VITALS — BP 104/69 | HR 64 | Ht 66.0 in | Wt 175.0 lb

## 2021-08-28 DIAGNOSIS — M5417 Radiculopathy, lumbosacral region: Secondary | ICD-10-CM

## 2021-08-28 NOTE — Patient Instructions (Signed)
Continue home exercises  Return to clinic as needed

## 2021-08-28 NOTE — Progress Notes (Signed)
° ° °  Follow-up Visit   Date: 08/28/21   Kristin Lewis MRN: [id number] DOB: 12/12/1950   Interim History: Kristin Lewis is a 71 y.o. left-handed Caucasian female with right breast cancer s/p bilateral mastectomy (2014) returning to the clinic for follow-up of right lumbosacral radiculopathy.  The patient was accompanied to the clinic by self.  History of present illness: Starting around 20 years ago, she began having achy pain over the posterior right thigh and occasionally into the posterior lower leg. These spells would come and go over the years.  For the past two years, she has daily spells of pain, lasting several hours.  Repositioning helps.  She has some numbness over the back of the thigh, nothing which radiates down her leg into the foot.  No weakness, imbalance, or falls. Her last spells was triggered by pulling the cover off her pool and falling backwards.  She denies similar problems in the left leg or muscle cramps.    UPDATE 08/28/2021:  She is here for follow-up. She completed PT for a few weeks and felt that it has helped her right thigh pain.  She no longer has pain in the right leg.  She continues to have spells of numbness which lasts about 10-min daily. She tends to elevate her legs and rest, which helps.  She has been more active than usual after inheriting her father property, so has been lifting and moving furniture, fortunately, this has not exacerbated her symptoms.   Medications:  Current Outpatient Medications on File Prior to Visit  Medication Sig Dispense Refill   Cholecalciferol 25 MCG (1000 UT) tablet Take 1,000 Units by mouth daily.     sertraline (ZOLOFT) 100 MG tablet Take 100 mg by mouth daily.     Current Facility-Administered Medications on File Prior to Visit  Medication Dose Route Frequency Provider Last Rate Last Admin   0.9 %  sodium chloride infusion  500 mL Intravenous Continuous Nandigam, Kavitha V, MD        Allergies: No Known  Allergies  Vital Signs:  BP 104/69    Pulse 64    Ht 5\' 6"  (1.676 m)    Wt 175 lb (79.4 kg)    SpO2 94%    BMI 28.25 kg/m   Neurological Exam: MENTAL STATUS including orientation to time, place, person, recent and remote memory, attention span and concentration, language, and fund of knowledge is normal.  Speech is not dysarthric.  CRANIAL NERVES:  Pupils equal round and reactive to light.  Normal conjugate, extra-ocular eye movements in all directions of gaze.  No ptosis.  Face is symmetric. Palate elevates symmetrically.  Tongue is midline.  MOTOR:  Motor strength is 5/5 in all extremities.  No atrophy, fasciculations or abnormal movements.  No pronator drift.  Tone is normal.    MSRs:  Reflexes are 2+/4 throughout, except 3+/4 bilateral knees.  SENSORY:  Intact to vibration throughout.  COORDINATION/GAIT:  Normal finger-to- nose-finger.  Intact rapid alternating movements bilaterally.  Gait narrow based and stable Stressed and tandem gait intact.    Data:n/a  IMPRESSION/PLAN: Right lumbosacral radiculopathy, improved with PT  - Continue home exercises  - If symptoms get worse, restart PT  Return to clinic as needed  Thank you for allowing me to participate in patient's care.  If I can answer any additional questions, I would be pleased to do so.    Sincerely,    Shena Vinluan K. Posey Pronto, DO

## 2021-09-01 DIAGNOSIS — H25811 Combined forms of age-related cataract, right eye: Secondary | ICD-10-CM | POA: Diagnosis not present

## 2021-09-15 DIAGNOSIS — H25812 Combined forms of age-related cataract, left eye: Secondary | ICD-10-CM | POA: Diagnosis not present

## 2021-12-25 DIAGNOSIS — K219 Gastro-esophageal reflux disease without esophagitis: Secondary | ICD-10-CM | POA: Diagnosis not present

## 2021-12-25 DIAGNOSIS — E782 Mixed hyperlipidemia: Secondary | ICD-10-CM | POA: Diagnosis not present

## 2021-12-25 DIAGNOSIS — R7303 Prediabetes: Secondary | ICD-10-CM | POA: Diagnosis not present

## 2022-01-03 DIAGNOSIS — R7303 Prediabetes: Secondary | ICD-10-CM | POA: Diagnosis not present

## 2022-01-03 DIAGNOSIS — R100 Acute abdomen: Secondary | ICD-10-CM | POA: Diagnosis not present

## 2022-01-03 DIAGNOSIS — E782 Mixed hyperlipidemia: Secondary | ICD-10-CM | POA: Diagnosis not present

## 2022-01-03 DIAGNOSIS — K219 Gastro-esophageal reflux disease without esophagitis: Secondary | ICD-10-CM | POA: Diagnosis not present

## 2022-01-03 DIAGNOSIS — K5900 Constipation, unspecified: Secondary | ICD-10-CM | POA: Diagnosis not present

## 2022-01-03 DIAGNOSIS — E559 Vitamin D deficiency, unspecified: Secondary | ICD-10-CM | POA: Diagnosis not present

## 2022-01-19 ENCOUNTER — Telehealth: Payer: Self-pay | Admitting: Gastroenterology

## 2022-01-19 NOTE — Telephone Encounter (Signed)
Please call and check what exactly patient is experiencing and triage the phone call.  Thank you

## 2022-01-19 NOTE — Telephone Encounter (Signed)
Called the patient back. No answer. Left her a voicemail of my call.

## 2022-01-24 ENCOUNTER — Emergency Department (HOSPITAL_BASED_OUTPATIENT_CLINIC_OR_DEPARTMENT_OTHER)
Admission: EM | Admit: 2022-01-24 | Discharge: 2022-01-24 | Disposition: A | Discharge status: Home / Self Care | Payer: Medicare Other | Attending: Emergency Medicine | Admitting: Emergency Medicine

## 2022-01-24 ENCOUNTER — Other Ambulatory Visit: Payer: Self-pay

## 2022-01-24 ENCOUNTER — Encounter (HOSPITAL_BASED_OUTPATIENT_CLINIC_OR_DEPARTMENT_OTHER): Payer: Self-pay

## 2022-01-24 ENCOUNTER — Emergency Department (HOSPITAL_BASED_OUTPATIENT_CLINIC_OR_DEPARTMENT_OTHER): Payer: Medicare Other

## 2022-01-24 DIAGNOSIS — R1032 Left lower quadrant pain: Secondary | ICD-10-CM | POA: Insufficient documentation

## 2022-01-24 DIAGNOSIS — R1031 Right lower quadrant pain: Secondary | ICD-10-CM | POA: Insufficient documentation

## 2022-01-24 DIAGNOSIS — R748 Abnormal levels of other serum enzymes: Secondary | ICD-10-CM | POA: Diagnosis not present

## 2022-01-24 DIAGNOSIS — R1030 Lower abdominal pain, unspecified: Secondary | ICD-10-CM

## 2022-01-24 DIAGNOSIS — Z853 Personal history of malignant neoplasm of breast: Secondary | ICD-10-CM | POA: Diagnosis not present

## 2022-01-24 DIAGNOSIS — R109 Unspecified abdominal pain: Secondary | ICD-10-CM | POA: Diagnosis not present

## 2022-01-24 LAB — COMPREHENSIVE METABOLIC PANEL
ALT: 22 U/L (ref 0–44)
AST: 24 U/L (ref 15–41)
Albumin: 4.4 g/dL (ref 3.5–5.0)
Alkaline Phosphatase: 69 U/L (ref 38–126)
Anion gap: 8 (ref 5–15)
BUN: 15 mg/dL (ref 8–23)
CO2: 25 mmol/L (ref 22–32)
Calcium: 9.9 mg/dL (ref 8.9–10.3)
Chloride: 105 mmol/L (ref 98–111)
Creatinine, Ser: 1.07 mg/dL — ABNORMAL HIGH (ref 0.44–1.00)
GFR, Estimated: 56 mL/min — ABNORMAL LOW (ref >60)
Glucose, Bld: 122 mg/dL — ABNORMAL HIGH (ref 70–99)
Potassium: 4 mmol/L (ref 3.5–5.1)
Sodium: 138 mmol/L (ref 135–145)
Total Bilirubin: 0.5 mg/dL (ref 0.3–1.2)
Total Protein: 7.6 g/dL (ref 6.5–8.1)

## 2022-01-24 LAB — URINALYSIS, ROUTINE W REFLEX MICROSCOPIC
Bilirubin Urine: NEGATIVE
Glucose, UA: NEGATIVE mg/dL
Hgb urine dipstick: NEGATIVE
Ketones, ur: NEGATIVE mg/dL
Nitrite: NEGATIVE
Protein, ur: NEGATIVE mg/dL
Specific Gravity, Urine: 1.018 (ref 1.005–1.030)
pH: 5.5 (ref 5.0–8.0)

## 2022-01-24 LAB — CBC
HCT: 39.2 % (ref 36.0–46.0)
Hemoglobin: 12.9 g/dL (ref 12.0–15.0)
MCH: 29.1 pg (ref 26.0–34.0)
MCHC: 32.9 g/dL (ref 30.0–36.0)
MCV: 88.5 fL (ref 80.0–100.0)
Platelets: 248 10*3/uL (ref 150–400)
RBC: 4.43 MIL/uL (ref 3.87–5.11)
RDW: 12.7 % (ref 11.5–15.5)
WBC: 7.1 10*3/uL (ref 4.0–10.5)
nRBC: 0 % (ref 0.0–0.2)

## 2022-01-24 LAB — LIPASE, BLOOD: Lipase: 56 U/L — ABNORMAL HIGH (ref 11–51)

## 2022-01-24 MED ORDER — TRAMADOL HCL 50 MG PO TABS: 50.0000 mg | ORAL_TABLET | Freq: Four times a day (QID) | ORAL | 0 refills | Status: AC | PRN

## 2022-01-24 MED ORDER — IOHEXOL 300 MG/ML  SOLN
80.0000 mL | Freq: Once | INTRAMUSCULAR | Status: AC | PRN
Start: 2022-01-24 — End: 2022-01-24
  Administered 2022-01-24: 80 mL via INTRAVENOUS

## 2022-01-24 NOTE — ED Triage Notes (Signed)
Patient here POV from Home.  Endorses Lower ABD Pain since May. Spoke with PCP over Zoom in Late May and was given Medications which were Not effective and Discomfort remained Unchanged. Constant in [place] but at Times Pain switches from RLQ to LLQ   No N/V/D. No Constipation. No Fevers. No Urinary Symptoms.   NAD Noted during Triage. A&Ox4. GCS 15. Ambulatory.

## 2022-01-24 NOTE — ED Notes (Signed)
Reviewed AVS/discharge instruction with patient. Time allotted for and all questions answered. Patient is agreeable for d/c and escorted to ed exit by staff.  

## 2022-01-24 NOTE — ED Provider Notes (Signed)
[facility] EMERGENCY DEPT Provider Note   CSN: [id number] Arrival date & time: 01/24/22  1441     History  Chief Complaint  Patient presents with   Abdominal Pain    Kristin Lewis is a 71 y.o. female.  Patient with a complaint of bilateral lower quadrant abdominal pain since early May.  Spoke to primary care doctor during a Zoom evaluation and they tried some stool softeners.  No improvement.  No nausea vomiting and diarrhea no constipation no fevers.  Patient had pain like this 2 years ago evaluation without any specific findings.  No blood in her bowel movements.  Past medical history significant for depression gastroesophageal reflux disease and breast cancer right side only in 2014.  Bilateral mastectomies in 2014 patient never smoked.       Home Medications Prior to Admission medications   Medication Sig Start Date End Date Taking? Authorizing Provider  Cholecalciferol 25 MCG (1000 UT) tablet Take 1,000 Units by mouth daily.    [provider]  sertraline (ZOLOFT) 100 MG tablet Take 100 mg by mouth daily.    [provider]      Allergies    Patient has no known allergies.    Review of Systems   Review of Systems  Constitutional:  Negative for chills and fever.  HENT:  Negative for ear pain and sore throat.   Eyes:  Negative for pain and visual disturbance.  Respiratory:  Negative for cough and shortness of breath.   Cardiovascular:  Negative for chest pain and palpitations.  Gastrointestinal:  Positive for abdominal pain. Negative for vomiting.  Genitourinary:  Negative for dysuria and hematuria.  Musculoskeletal:  Negative for arthralgias and back pain.  Skin:  Negative for color change and rash.  Neurological:  Negative for seizures and syncope.  All other systems reviewed and are negative.   Physical Exam Updated Vital Signs BP 135/85   Pulse (!) 53   Temp 98.2 F (36.8 C)   Resp 15   Ht 1.676 m ('5\' 6"'$ )   Wt 79.4 kg    SpO2 100%   BMI 28.25 kg/m  Physical Exam Vitals and nursing note reviewed.  Constitutional:      General: She is not in acute distress.    Appearance: She is well-developed. She is not ill-appearing or toxic-appearing.  HENT:     Head: Normocephalic and atraumatic.  Eyes:     Conjunctiva/sclera: Conjunctivae normal.  Cardiovascular:     Rate and Rhythm: Normal rate and regular rhythm.     Heart sounds: No murmur heard. Pulmonary:     Effort: Pulmonary effort is normal. No respiratory distress.     Breath sounds: Normal breath sounds.  Abdominal:     General: Bowel sounds are normal. There is no distension.     Palpations: Abdomen is soft.     Tenderness: There is no abdominal tenderness.     Hernia: No hernia is present.     Comments: Abdomen soft nontender.  Musculoskeletal:        General: No swelling.     Cervical back: Neck supple.  Skin:    General: Skin is warm and dry.     Capillary Refill: Capillary refill takes less than 2 seconds.  Neurological:     General: No focal deficit present.     Mental Status: She is alert and oriented to person, place, and time.  Psychiatric:        Mood and Affect: Mood normal.  ED Results / Procedures / Treatments   Labs (all labs ordered are listed, but only abnormal results are displayed) Labs Reviewed  LIPASE, BLOOD - Abnormal; Notable for the following components:      Result Value   Lipase 56 (*)    All other components within normal limits  COMPREHENSIVE METABOLIC PANEL - Abnormal; Notable for the following components:   Glucose, Bld 122 (*)    Creatinine, Ser 1.07 (*)    GFR, Estimated 56 (*)    All other components within normal limits  URINALYSIS, ROUTINE W REFLEX MICROSCOPIC - Abnormal; Notable for the following components:   Leukocytes,Ua SMALL (*)    Bacteria, UA RARE (*)    All other components within normal limits  CBC    EKG None  Radiology No results found.  Procedures Procedures     Medications Ordered in ED Medications - No data to display  ED Course/ Medical Decision Making/ A&P                           Medical Decision Making Amount and/or Complexity of Data Reviewed Labs: ordered. Radiology: ordered.  Risk Prescription drug management.   Patient's labs here lipase slightly elevated at 56 but patient's symptoms are lower quadrant abdominal pain complete metabolic panel glucose 390 GFR down a little bit at 56 but we can still do scan with contrast.  Liver function tests normal.  Potassium normal.  CBC no leukocytosis hemoglobin 12.9.  Urinalysis negative for urinary tract infection.  Based on her lower quadrant abdominal pain complaint will get CT scan of the abdomen.  Disposition based on those results.  CT scan of the abdomen without any acute findings.  Normal appendix no diverticulitis but there is evidence of diverticulosis.  We will have patient follow back up with primary care provider.  We will treat symptomatically.   Final Clinical Impression(s) / ED Diagnoses Final diagnoses:  Lower abdominal pain    Rx / DC Orders ED Discharge Orders     None         Fredia Sorrow, MD 01/24/22 2126

## 2022-01-24 NOTE — Discharge Instructions (Addendum)
Work-up for the lower abdominal pain without any acute findings.  Tramadol as directed.  Make an appointment follow-up with your doctor.  Also can follow-up with [facility] information provided above.

## 2022-01-26 NOTE — Telephone Encounter (Signed)
Patient came by the office to talk. She has been seen in the ER for lower abdominal pain. Labs and CT were unremarkable. She tells me that with this persistent pain and her personal history of breast cancer, she finds she is consumed with the fear of this being cancer as well. She is not constipated. She does not have blood in her stool.  Last colonoscopy was 09/06/2016 with recall 2028. Appointment for evaluation of her symptoms on 01/31/22.

## 2022-01-29 ENCOUNTER — Ambulatory Visit (INDEPENDENT_AMBULATORY_CARE_PROVIDER_SITE_OTHER): Payer: Medicare Other | Admitting: Gastroenterology

## 2022-01-29 ENCOUNTER — Encounter: Payer: Self-pay | Admitting: Gastroenterology

## 2022-01-29 VITALS — BP 146/80 | HR 85 | Ht 66.0 in | Wt 170.0 lb

## 2022-01-29 DIAGNOSIS — R1030 Lower abdominal pain, unspecified: Secondary | ICD-10-CM | POA: Diagnosis not present

## 2022-01-29 DIAGNOSIS — K581 Irritable bowel syndrome with constipation: Secondary | ICD-10-CM | POA: Diagnosis not present

## 2022-01-29 DIAGNOSIS — R140 Abdominal distension (gaseous): Secondary | ICD-10-CM | POA: Diagnosis not present

## 2022-01-29 NOTE — Progress Notes (Signed)
Kristin Lewis    [id number]    16-May-1951  Primary Care Physician:Dewey, Mechele Claude, MD  Referring Physician: Fanny Bien, MD [address],  [place] [address]   Chief complaint: Left lower quadrant abdominal pain, change in bowel habits  HPI: 71 year old very pleasant female with history of breast CA s/p bilateral mastectomy here with complaints of intermittent lower abdominal pain and change in bowel habits.  She is having hard small pellets with increased abdominal bloating and lower abdominal discomfort.  Denies any change in diet or medications.  No rectal bleeding. She is worried about recurrence of breast cancer or metastatic disease given her history  No family history of colon cancer  Colonoscopy 09/06/2016 - The perianal and digital rectal examinations were normal. - Non-bleeding internal hemorrhoids were found during retroflexion. The hemorrhoids were medium-sized. - The exam was otherwise without abnormality.   Outpatient Encounter Medications as of 01/29/2022  Medication Sig   Cholecalciferol 25 MCG (1000 UT) tablet Take 1,000 Units by mouth daily.   sertraline (ZOLOFT) 100 MG tablet Take 100 mg by mouth daily.   traMADol (ULTRAM) 50 MG tablet Take 1 tablet (50 mg total) by mouth every 6 (six) hours as needed. (Patient not taking: Reported on 01/29/2022)   Facility-Administered Encounter Medications as of 01/29/2022  Medication   0.9 %  sodium chloride infusion    Allergies as of 01/29/2022   (No Known Allergies)    Past Medical History:  Diagnosis Date   Breast cancer ([facility])    "right only" (07/15/2013)   Depression    GERD (gastroesophageal reflux disease)     Past Surgical History:  Procedure Laterality Date   BLEPHAROPLASTY Bilateral    BREAST BIOPSY  06/17/2013   sterotactic/notes 06/23/2013   BREAST RECONSTRUCTION     MASTECTOMY Bilateral 07/15/2013   prophylactic left mastectomy/notes 06/23/2013   MASTECTOMY W/  SENTINEL NODE BIOPSY Bilateral 07/15/2013   Procedure: BILATERAL MASTECTOMY WITH  RIGHT SENTINEL LYMPH NODE BIOPSY;  Surgeon: Imogene Burn. [name], MD;  Location: [facility];  Service: General;  Laterality: Bilateral;  RIGHT NUC MED INJECTION 10:30   SENTINEL LYMPH NODE BIOPSY Right 07/15/2013   TUBAL LIGATION     tummy tuck      Family History  Problem Relation Age of Onset   Colon cancer Neg Hx    Stomach cancer Neg Hx    Rectal cancer Neg Hx    Esophageal cancer Neg Hx    Liver cancer Neg Hx     Social History   Socioeconomic History   Marital status: Single    Spouse name: Not on file   Number of children: 0   Years of education: Not on file   Highest education level: Not on file  Occupational History   Occupation: [employer]  Tobacco Use   Smoking status: Never   Smokeless tobacco: Never  Substance and Sexual Activity   Alcohol use: Yes    Alcohol/week: 1.0 standard drink of alcohol    Types: 1 Shots of liquor per week    Comment: less than 1 per month   Drug use: No   Sexual activity: Not Currently    Birth control/protection: Post-menopausal  Other Topics Concern   Not on file  Social History Narrative   Left handed   Drinks caffeine   One story home   Social Determinants of Health   Financial Resource Strain: Not on file  Food Insecurity:  Not on file  Transportation Needs: Not on file  Physical Activity: Not on file  Stress: Not on file  Social Connections: Not on file  Intimate Partner Violence: Not on file      Review of systems: All other review of systems negative except as mentioned in the HPI.   Physical Exam: Vitals:   01/29/22 1034  BP: (!) 146/80  Pulse: 85   Body mass index is 27.44 kg/m. Gen:      No acute distress HEENT:  sclera anicteric Abd:      soft, non-tender; no palpable masses, no distension Ext:    No edema Neuro: alert and oriented x 3 Psych: normal mood and affect  Data Reviewed:  Reviewed labs, radiology imaging,  old records and pertinent past GI work up   Assessment and Plan/Recommendations:  70 year old very pleasant female with history of breast cancer s/p bilateral mastectomy here with complaints of lower abdominal discomfort L greater than right and change in bowel habits  Abdominal discomfort, bloating secondary to IBS constipation Plan for bowel purge with MiraLAX and then start daily stool softener Colace Use Benefiber 1 tablespoon twice daily Increase dietary fiber and water intake  She is up-to-date with colorectal cancer screening.  If continues to have persistent symptoms will consider colonoscopy for further evaluation  Return in 2 to 3 months    The patient was provided an opportunity to ask questions and all were answered. The patient agreed with the plan and demonstrated an understanding of the instructions.  Damaris Hippo , MD    CC: Fanny Bien, MD

## 2022-02-02 ENCOUNTER — Encounter: Payer: Self-pay | Admitting: Gastroenterology

## 2022-02-09 ENCOUNTER — Ambulatory Visit: Payer: Medicare Other | Admitting: Physician Assistant

## 2022-02-14 DIAGNOSIS — K5900 Constipation, unspecified: Secondary | ICD-10-CM | POA: Diagnosis not present

## 2022-02-14 DIAGNOSIS — K589 Irritable bowel syndrome without diarrhea: Secondary | ICD-10-CM | POA: Diagnosis not present

## 2022-02-14 DIAGNOSIS — R109 Unspecified abdominal pain: Secondary | ICD-10-CM | POA: Diagnosis not present

## 2022-02-14 DIAGNOSIS — K219 Gastro-esophageal reflux disease without esophagitis: Secondary | ICD-10-CM | POA: Diagnosis not present

## 2022-02-26 ENCOUNTER — Encounter: Payer: Self-pay | Admitting: Gastroenterology

## 2022-02-26 ENCOUNTER — Ambulatory Visit (INDEPENDENT_AMBULATORY_CARE_PROVIDER_SITE_OTHER): Payer: Medicare Other | Admitting: Gastroenterology

## 2022-02-26 VITALS — BP 124/66 | HR 82 | Ht 66.0 in | Wt 168.0 lb

## 2022-02-26 DIAGNOSIS — K5904 Chronic idiopathic constipation: Secondary | ICD-10-CM

## 2022-02-26 DIAGNOSIS — R1030 Lower abdominal pain, unspecified: Secondary | ICD-10-CM | POA: Diagnosis not present

## 2022-02-26 MED ORDER — PLENVU 140 G PO SOLR: 1.0000 | ORAL | 0 refills | Status: DC

## 2022-02-26 NOTE — Progress Notes (Signed)
Kristin Lewis    [id number]    10-16-1950  Primary Care Physician:Dewey, Mechele Claude, MD  Referring Physician: Fanny Bien, MD [address],  [place] [address]   Chief complaint: Lower abdominal pain, constipation  HPI:  71 year old very pleasant female with history of breast CA s/p bilateral mastectomy here with complaints of intermittent lower abdominal pain and change in bowel habits.  She is having slightly improved bowel habits with addition of Benefiber.  MiraLAX bowel purge helped transiently but she is continuing to have bilateral lower abdominal discomfort.  She is worried about potential missed lesion or potential recurrence of breast cancer or metastatic disease given her history   No family history of colon cancer   Colonoscopy 09/06/2016 - The perianal and digital rectal examinations were normal. - Non-bleeding internal hemorrhoids were found during retroflexion. The hemorrhoids were medium-sized. - The exam was otherwise without abnormality.   Outpatient Encounter Medications as of 02/26/2022  Medication Sig   Cholecalciferol 25 MCG (1000 UT) tablet Take 1,000 Units by mouth daily.   sertraline (ZOLOFT) 100 MG tablet Take 100 mg by mouth daily.   traMADol (ULTRAM) 50 MG tablet Take 1 tablet (50 mg total) by mouth every 6 (six) hours as needed. (Patient not taking: Reported on 01/29/2022)   Facility-Administered Encounter Medications as of 02/26/2022  Medication   0.9 %  sodium chloride infusion    Allergies as of 02/26/2022   (No Known Allergies)    Past Medical History:  Diagnosis Date   Breast cancer ([facility])    "right only" (07/15/2013)   Depression    GERD (gastroesophageal reflux disease)     Past Surgical History:  Procedure Laterality Date   BLEPHAROPLASTY Bilateral    BREAST BIOPSY  06/17/2013   sterotactic/notes 06/23/2013   BREAST RECONSTRUCTION     MASTECTOMY Bilateral 07/15/2013   prophylactic left  mastectomy/notes 06/23/2013   MASTECTOMY W/ SENTINEL NODE BIOPSY Bilateral 07/15/2013   Procedure: BILATERAL MASTECTOMY WITH  RIGHT SENTINEL LYMPH NODE BIOPSY;  Surgeon: Imogene Burn. [name], MD;  Location: [facility];  Service: General;  Laterality: Bilateral;  RIGHT NUC MED INJECTION 10:30   SENTINEL LYMPH NODE BIOPSY Right 07/15/2013   TUBAL LIGATION     tummy tuck      Family History  Problem Relation Age of Onset   Colon cancer Neg Hx    Stomach cancer Neg Hx    Rectal cancer Neg Hx    Esophageal cancer Neg Hx    Liver cancer Neg Hx     Social History   Socioeconomic History   Marital status: Single    Spouse name: Not on file   Number of children: 0   Years of education: Not on file   Highest education level: Not on file  Occupational History   Occupation: [employer]  Tobacco Use   Smoking status: Never   Smokeless tobacco: Never  Substance and Sexual Activity   Alcohol use: Yes    Alcohol/week: 1.0 standard drink of alcohol    Types: 1 Shots of liquor per week    Comment: less than 1 per month   Drug use: No   Sexual activity: Not Currently    Birth control/protection: Post-menopausal  Other Topics Concern   Not on file  Social History Narrative   Left handed   Drinks caffeine   One story home   Social Determinants of Health   Financial Resource Strain: Not  on file  Food Insecurity: Not on file  Transportation Needs: Not on file  Physical Activity: Not on file  Stress: Not on file  Social Connections: Not on file  Intimate Partner Violence: Not on file      Review of systems: All other review of systems negative except as mentioned in the HPI.   Physical Exam: Vitals:   02/26/22 1551  BP: 124/66  Pulse: 82   Body mass index is 27.12 kg/m. Gen:      No acute distress HEENT:  sclera anicteric Abd:      soft, non-tender; no palpable masses, no distension Ext:    No edema Neuro: alert and oriented x 3 Psych: normal mood and affect  Data  Reviewed:  Reviewed labs, radiology imaging, old records and pertinent past GI work up   Assessment and Plan/Recommendations:  71 year old very pleasant female with history of breast cancer s/p bilateral mastectomy here with complaints of lower abdominal discomfort associated with change in bowel habits   Abdominal discomfort, bloating likely secondary to IBS constipation but cannot exclude neoplastic lesion Patient is worried about potential mass lesion or metastatic lesion from recurrent breast cancer Schedule for colonoscopy for further evaluation of abdominal pain and constipation The risks and benefits as well as alternatives of endoscopic procedure(s) have been discussed and reviewed. All questions answered. The patient agrees to proceed.  Add MiraLAX 1 capful daily Use Benefiber 1 tablespoon twice daily Increase dietary fiber and water intake   Return in 2 to 3 months  The patient was provided an opportunity to ask questions and all were answered. The patient agreed with the plan and demonstrated an understanding of the instructions.  Damaris Hippo , MD    CC: Fanny Bien, MD

## 2022-02-26 NOTE — Patient Instructions (Signed)
Please follow up with Dr Silverio Decamp in 3 months.  You have been scheduled for a colonoscopy. Please follow written instructions given to you at your visit today.  Please pick up your prep supplies at the pharmacy within the next 1-3 days. If you use inhalers (even only as needed), please bring them with you on the day of your procedure.  If you are age 70 or older, your body mass index should be between 23-30. Your Body mass index is 27.12 kg/m. If this is out of the aforementioned range listed, please consider follow up with your Primary Care Provider.  If you are age 85 or younger, your body mass index should be between 19-25. Your Body mass index is 27.12 kg/m. If this is out of the aformentioned range listed, please consider follow up with your Primary Care Provider.   ________________________________________________________  The [facility] providers would like to encourage you to use [facility] to communicate with providers for non-urgent requests or questions.  Due to long hold times on the telephone, sending your provider a message by [facility] may be a faster and more efficient way to get a response.  Please allow 48 business hours for a response.  Please remember that this is for non-urgent requests.  _______________________________________________________ Due to recent changes in healthcare laws, you may see the results of your imaging and laboratory studies on MyChart before your provider has had a chance to review them.  We understand that in some cases there may be results that are confusing or concerning to you. Not all laboratory results come back in the same time frame and the provider may be waiting for multiple results in order to interpret others.  Please give [place] 48 hours in order for your provider to thoroughly review all the results before contacting the office for clarification of your results.

## 2022-03-07 ENCOUNTER — Ambulatory Visit: Payer: Medicare Other | Admitting: Gastroenterology

## 2022-03-12 DIAGNOSIS — K5900 Constipation, unspecified: Secondary | ICD-10-CM | POA: Diagnosis not present

## 2022-03-12 DIAGNOSIS — K589 Irritable bowel syndrome without diarrhea: Secondary | ICD-10-CM | POA: Diagnosis not present

## 2022-03-12 DIAGNOSIS — K219 Gastro-esophageal reflux disease without esophagitis: Secondary | ICD-10-CM | POA: Diagnosis not present

## 2022-03-28 ENCOUNTER — Encounter: Payer: Self-pay | Admitting: Gastroenterology

## 2022-03-28 ENCOUNTER — Ambulatory Visit (AMBULATORY_SURGERY_CENTER): Payer: Medicare Other | Admitting: Gastroenterology

## 2022-03-28 VITALS — BP 109/50 | HR 52 | Temp 98.3°F | Resp 14 | Ht 66.0 in | Wt 168.0 lb

## 2022-03-28 DIAGNOSIS — K5904 Chronic idiopathic constipation: Secondary | ICD-10-CM

## 2022-03-28 DIAGNOSIS — R1030 Lower abdominal pain, unspecified: Secondary | ICD-10-CM | POA: Diagnosis not present

## 2022-03-28 DIAGNOSIS — K649 Unspecified hemorrhoids: Secondary | ICD-10-CM | POA: Diagnosis not present

## 2022-03-28 DIAGNOSIS — K5730 Diverticulosis of large intestine without perforation or abscess without bleeding: Secondary | ICD-10-CM | POA: Diagnosis not present

## 2022-03-28 MED ORDER — SODIUM CHLORIDE 0.9 % IV SOLN: 500.0000 mL | INTRAVENOUS | Status: DC

## 2022-03-28 NOTE — Op Note (Signed)
[facility] Patient Name: Lynden Carrithers Procedure Date: 03/28/2022 2:50 PM MRN: [id number] Endoscopist: Mauri Pole , MD Age: 71 Referring MD:  Date of Birth: Nov 04, 1950 Gender: Female Account #: [id number] Procedure:                Colonoscopy Indications:              Abdominal pain in the left lower quadrant Medicines:                Monitored Anesthesia Care Procedure:                Pre-Anesthesia Assessment:                           - Prior to the procedure, a History and Physical                            was performed, and patient medications and                            allergies were reviewed. The patient's tolerance of                            previous anesthesia was also reviewed. The risks                            and benefits of the procedure and the sedation                            options and risks were discussed with the patient.                            All questions were answered, and informed consent                            was obtained. Prior Anticoagulants: The patient has                            taken no previous anticoagulant or antiplatelet                            agents. ASA Grade Assessment: II - A patient with                            mild systemic disease. After reviewing the risks                            and benefits, the patient was deemed in                            satisfactory condition to undergo the procedure.                           After obtaining informed consent, the colonoscope  was passed under direct vision. Throughout the                            procedure, the patient's blood pressure, pulse, and                            oxygen saturations were monitored continuously. The                            Olympus PCF-[id number]) Colonoscope was                            introduced through the anus and advanced to the the                            cecum,  identified by appendiceal orifice and                            ileocecal valve. The colonoscopy was performed                            without difficulty. The patient tolerated the                            procedure well. The quality of the bowel                            preparation was good. The ileocecal valve,                            appendiceal orifice, and rectum were photographed. Scope In: 2:58:29 PM Scope Out: 3:11:49 PM Scope Withdrawal Time: 0 hours 5 minutes 42 seconds  Total Procedure Duration: 0 hours 13 minutes 20 seconds  Findings:                 The perianal and digital rectal examinations were                            normal.                           Scattered small-mouthed diverticula were found in                            the sigmoid colon. There was evidence of an                            impacted diverticulum.                           Non-bleeding external and internal hemorrhoids were                            found during retroflexion. The hemorrhoids were  medium-sized. Complications:            No immediate complications. Estimated Blood Loss:     Estimated blood loss was minimal. Impression:               - Mild diverticulosis in the sigmoid colon. There                            was evidence of an impacted diverticulum.                           - Non-bleeding external and internal hemorrhoids.                           - No specimens collected. Recommendation:           - Patient has a contact number available for                            emergencies. The signs and symptoms of potential                            delayed complications were discussed with the                            patient. Return to normal activities tomorrow.                            Written discharge instructions were provided to the                            patient.                           - Resume previous diet.                            - Continue present medications.                           - No repeat colonoscopy due to age and the absence                            of colonic polyps. Mauri Pole, MD 03/28/2022 3:18:08 PM This report has been signed electronically.

## 2022-03-28 NOTE — Patient Instructions (Signed)
Please read handouts provided. Continue present medications.   YOU HAD AN ENDOSCOPIC PROCEDURE TODAY AT [facility]:   Refer to the procedure report that was given to you for any specific questions about what was found during the examination.  If the procedure report does not answer your questions, please call your gastroenterologist to clarify.  If you requested that your care partner not be given the details of your procedure findings, then the procedure report has been included in a sealed envelope for you to review at your convenience later.  YOU SHOULD EXPECT: Some feelings of bloating in the abdomen. Passage of more gas than usual.  Walking can help get rid of the air that was put into your GI tract during the procedure and reduce the bloating. If you had a lower endoscopy (such as a colonoscopy or flexible sigmoidoscopy) you may notice spotting of blood in your stool or on the toilet paper. If you underwent a bowel prep for your procedure, you may not have a normal bowel movement for a few days.  Please Note:  You might notice some irritation and congestion in your nose or some drainage.  This is from the oxygen used during your procedure.  There is no need for concern and it should clear up in a day or so.  SYMPTOMS TO REPORT IMMEDIATELY:  Following lower endoscopy (colonoscopy or flexible sigmoidoscopy):  Excessive amounts of blood in the stool  Significant tenderness or worsening of abdominal pains  Swelling of the abdomen that is new, acute  Fever of 100F or higher.  For urgent or emergent issues, a gastroenterologist can be reached at any hour by calling [phone]. Do not use MyChart messaging for urgent concerns.    DIET:  We do recommend a small meal at first, but then you may proceed to your regular diet.  Drink plenty of fluids but you should avoid alcoholic beverages for 24 hours.  ACTIVITY:  You should plan to take it easy for the rest of today and you  should NOT DRIVE or use heavy machinery until tomorrow (because of the sedation medicines used during the test).    FOLLOW UP: Our staff will call the number listed on your records the next business day following your procedure.  We will call around 7:15- 8:00 am to check on you and address any questions or concerns that you may have regarding the information given to you following your procedure. If we do not reach you, we will leave a message.  If you develop any symptoms (ie: fever, flu-like symptoms, shortness of breath, cough etc.) before then, please call [phone].  If you test positive for Covid 19 in the 2 weeks post procedure, please call and report this information to [place].    If any biopsies were taken you will be contacted by phone or by letter within the next 1-3 weeks.  Please call us at [phone] if you have not heard about the biopsies in 3 weeks.    SIGNATURES/CONFIDENTIALITY: You and/or your care partner have signed paperwork which will be entered into your electronic medical record.  These signatures attest to the fact that that the information above on your After Visit Summary has been reviewed and is understood.  Full responsibility of the confidentiality of this discharge information lies with you and/or your care-partner.

## 2022-03-28 NOTE — Progress Notes (Unsigned)
Report to pacu rn. Vss. Care resumed by rn. 

## 2022-03-28 NOTE — Progress Notes (Unsigned)
Please refer to office visit note 02/26/22. No additional changes in H&P Patient is appropriate for planned procedure(s) and anesthesia in an ambulatory setting  K. Denzil Magnuson , MD [phone]

## 2022-03-29 ENCOUNTER — Telehealth: Payer: Self-pay

## 2022-03-29 NOTE — Telephone Encounter (Signed)
Left message on follow up call. 

## 2022-03-30 DIAGNOSIS — E782 Mixed hyperlipidemia: Secondary | ICD-10-CM | POA: Diagnosis not present

## 2022-03-30 DIAGNOSIS — R7303 Prediabetes: Secondary | ICD-10-CM | POA: Diagnosis not present

## 2022-04-04 DIAGNOSIS — Z23 Encounter for immunization: Secondary | ICD-10-CM | POA: Diagnosis not present

## 2022-04-04 DIAGNOSIS — F411 Generalized anxiety disorder: Secondary | ICD-10-CM | POA: Diagnosis not present

## 2022-04-04 DIAGNOSIS — K219 Gastro-esophageal reflux disease without esophagitis: Secondary | ICD-10-CM | POA: Diagnosis not present

## 2022-04-04 DIAGNOSIS — E1165 Type 2 diabetes mellitus with hyperglycemia: Secondary | ICD-10-CM | POA: Diagnosis not present

## 2022-04-04 DIAGNOSIS — K589 Irritable bowel syndrome without diarrhea: Secondary | ICD-10-CM | POA: Diagnosis not present

## 2022-04-04 DIAGNOSIS — E782 Mixed hyperlipidemia: Secondary | ICD-10-CM | POA: Diagnosis not present

## 2022-04-19 DIAGNOSIS — Z961 Presence of intraocular lens: Secondary | ICD-10-CM | POA: Diagnosis not present

## 2022-04-19 DIAGNOSIS — H35371 Puckering of macula, right eye: Secondary | ICD-10-CM | POA: Diagnosis not present

## 2022-05-30 DIAGNOSIS — Z0000 Encounter for general adult medical examination without abnormal findings: Secondary | ICD-10-CM | POA: Diagnosis not present

## 2022-05-30 DIAGNOSIS — Z1339 Encounter for screening examination for other mental health and behavioral disorders: Secondary | ICD-10-CM | POA: Diagnosis not present

## 2022-05-30 DIAGNOSIS — Z1331 Encounter for screening for depression: Secondary | ICD-10-CM | POA: Diagnosis not present

## 2022-06-06 DIAGNOSIS — Z23 Encounter for immunization: Secondary | ICD-10-CM | POA: Diagnosis not present

## 2022-06-26 ENCOUNTER — Ambulatory Visit: Payer: Self-pay

## 2022-06-26 NOTE — Patient Outreach (Signed)
  Care Coordination   06/26/2022 Name: Kristin Lewis MRN: [id number] DOB: 03/16/1951   Care Coordination Outreach Attempts:  An unsuccessful telephone outreach was attempted today to offer the patient information about available care coordination services as a benefit of their health plan.   Follow Up Plan:  Additional outreach attempts will be made to offer the patient care coordination information and services.   Encounter Outcome:  No Answer  Care Coordination Interventions Activated:  No   Care Coordination Interventions:  No, not indicated    Daneen Schick, BSW, CDP Social Worker, Certified Dementia Practitioner [facility] Care Management  Care Coordination [phone]

## 2022-10-03 DIAGNOSIS — R7303 Prediabetes: Secondary | ICD-10-CM | POA: Diagnosis not present

## 2022-10-03 DIAGNOSIS — E559 Vitamin D deficiency, unspecified: Secondary | ICD-10-CM | POA: Diagnosis not present

## 2022-10-03 DIAGNOSIS — E782 Mixed hyperlipidemia: Secondary | ICD-10-CM | POA: Diagnosis not present

## 2022-10-09 DIAGNOSIS — K219 Gastro-esophageal reflux disease without esophagitis: Secondary | ICD-10-CM | POA: Diagnosis not present

## 2022-10-09 DIAGNOSIS — C50919 Malignant neoplasm of unspecified site of unspecified female breast: Secondary | ICD-10-CM | POA: Diagnosis not present

## 2022-10-09 DIAGNOSIS — E559 Vitamin D deficiency, unspecified: Secondary | ICD-10-CM | POA: Diagnosis not present

## 2022-10-09 DIAGNOSIS — E782 Mixed hyperlipidemia: Secondary | ICD-10-CM | POA: Diagnosis not present

## 2022-10-09 DIAGNOSIS — R7303 Prediabetes: Secondary | ICD-10-CM | POA: Diagnosis not present

## 2022-10-29 DIAGNOSIS — Z01818 Encounter for other preprocedural examination: Secondary | ICD-10-CM | POA: Diagnosis not present

## 2023-04-15 DIAGNOSIS — R5383 Other fatigue: Secondary | ICD-10-CM | POA: Diagnosis not present

## 2023-04-15 DIAGNOSIS — E559 Vitamin D deficiency, unspecified: Secondary | ICD-10-CM | POA: Diagnosis not present

## 2023-04-15 DIAGNOSIS — E785 Hyperlipidemia, unspecified: Secondary | ICD-10-CM | POA: Diagnosis not present

## 2023-04-22 DIAGNOSIS — F411 Generalized anxiety disorder: Secondary | ICD-10-CM | POA: Diagnosis not present

## 2023-04-22 DIAGNOSIS — G4700 Insomnia, unspecified: Secondary | ICD-10-CM | POA: Diagnosis not present

## 2023-04-22 DIAGNOSIS — Z789 Other specified health status: Secondary | ICD-10-CM | POA: Diagnosis not present

## 2023-04-22 DIAGNOSIS — E559 Vitamin D deficiency, unspecified: Secondary | ICD-10-CM | POA: Diagnosis not present

## 2023-04-22 DIAGNOSIS — E782 Mixed hyperlipidemia: Secondary | ICD-10-CM | POA: Diagnosis not present

## 2023-04-22 DIAGNOSIS — R7303 Prediabetes: Secondary | ICD-10-CM | POA: Diagnosis not present

## 2023-04-22 DIAGNOSIS — F331 Major depressive disorder, recurrent, moderate: Secondary | ICD-10-CM | POA: Diagnosis not present

## 2023-05-09 DIAGNOSIS — Z23 Encounter for immunization: Secondary | ICD-10-CM | POA: Diagnosis not present

## 2023-06-05 DIAGNOSIS — Z0000 Encounter for general adult medical examination without abnormal findings: Secondary | ICD-10-CM | POA: Diagnosis not present

## 2023-06-05 DIAGNOSIS — Z1339 Encounter for screening examination for other mental health and behavioral disorders: Secondary | ICD-10-CM | POA: Diagnosis not present

## 2023-06-05 DIAGNOSIS — Z1331 Encounter for screening for depression: Secondary | ICD-10-CM | POA: Diagnosis not present

## 2023-06-06 DIAGNOSIS — H35371 Puckering of macula, right eye: Secondary | ICD-10-CM | POA: Diagnosis not present

## 2023-06-06 DIAGNOSIS — Z961 Presence of intraocular lens: Secondary | ICD-10-CM | POA: Diagnosis not present

## 2023-06-06 DIAGNOSIS — H26491 Other secondary cataract, right eye: Secondary | ICD-10-CM | POA: Diagnosis not present

## 2024-02-24 DIAGNOSIS — R739 Hyperglycemia, unspecified: Secondary | ICD-10-CM | POA: Diagnosis not present

## 2024-02-24 DIAGNOSIS — R5383 Other fatigue: Secondary | ICD-10-CM | POA: Diagnosis not present

## 2024-02-24 DIAGNOSIS — E785 Hyperlipidemia, unspecified: Secondary | ICD-10-CM | POA: Diagnosis not present

## 2024-02-24 DIAGNOSIS — E559 Vitamin D deficiency, unspecified: Secondary | ICD-10-CM | POA: Diagnosis not present

## 2024-02-27 DIAGNOSIS — K219 Gastro-esophageal reflux disease without esophagitis: Secondary | ICD-10-CM | POA: Diagnosis not present

## 2024-02-27 DIAGNOSIS — Z79899 Other long term (current) drug therapy: Secondary | ICD-10-CM | POA: Diagnosis not present

## 2024-02-27 DIAGNOSIS — Z789 Other specified health status: Secondary | ICD-10-CM | POA: Diagnosis not present

## 2024-02-27 DIAGNOSIS — R7303 Prediabetes: Secondary | ICD-10-CM | POA: Diagnosis not present

## 2024-02-27 DIAGNOSIS — E782 Mixed hyperlipidemia: Secondary | ICD-10-CM | POA: Diagnosis not present

## 2024-02-27 DIAGNOSIS — E559 Vitamin D deficiency, unspecified: Secondary | ICD-10-CM | POA: Diagnosis not present

## 2024-03-16 ENCOUNTER — Ambulatory Visit (INDEPENDENT_AMBULATORY_CARE_PROVIDER_SITE_OTHER): Admitting: Podiatry

## 2024-03-16 ENCOUNTER — Encounter: Payer: Self-pay | Admitting: Podiatry

## 2024-03-16 DIAGNOSIS — B351 Tinea unguium: Secondary | ICD-10-CM | POA: Diagnosis not present

## 2024-03-16 MED ORDER — TERBINAFINE HCL 250 MG PO TABS: ORAL_TABLET | ORAL | 0 refills | Status: AC

## 2024-03-18 NOTE — Progress Notes (Signed)
 Subjective:   Patient ID: Kristin Lewis, female   DOB: 73 y.o.   MRN: [id number]   HPI Patient presents with discoloration of second nail right and some small discoloration of several adjacent nails.  States that she has been very pleased in the past with her treatment for fungus but that it has unfortunately reoccurred over time.  Not having pain but concerned and patient does not smoke likes to be active   Review of Systems  All other systems reviewed and are negative.       Objective:  Physical Exam Vitals and nursing note reviewed.  Constitutional:      Appearance: She is well-developed.  Pulmonary:     Effort: Pulmonary effort is normal.  Musculoskeletal:        General: Normal range of motion.  Skin:    General: Skin is warm.  Neurological:     Mental Status: She is alert.     Neurovascular status intact muscle strength was found to be adequate range of motion is adequate with the patient found to have discoloration of the nailbed right second and into several adjacent nails of the localized nature.  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  I at this point reviewed oral antifungal treatment and we are going to do the pulse that we did last time 1 pill/day for 7 days for 4 months and educated her on this and wrote the prescription.  Then schedule for approximate 3 laser treatments and will be seen back to have this done in the next several weeks with all questions answered

## 2024-03-24 ENCOUNTER — Ambulatory Visit

## 2024-04-24 ENCOUNTER — Ambulatory Visit (INDEPENDENT_AMBULATORY_CARE_PROVIDER_SITE_OTHER): Payer: Self-pay

## 2024-04-24 DIAGNOSIS — B351 Tinea unguium: Secondary | ICD-10-CM

## 2024-04-24 NOTE — Patient Instructions (Signed)

## 2024-04-24 NOTE — Progress Notes (Signed)
 Patient presents today for the 1st laser treatment. Diagnosed with mycotic nail infection by Dr. Magdalen.   Toenail most affected right 3rd with some thickening and discoloration on the right 4th.  All other systems are negative.  Nails were filed thin. Laser therapy was administered to 1-5 toenails right foot and patient tolerated the treatment well. All safety precautions were in place. She is currently on Lamisil  and is in her first week of taking it. Re-education on laser eye protection necessity due to patient removing her glasses prior to being given go ahead.   Post treatment instructions reviewed and provided to patient. Patient had no questions regarding plan of care.   Follow up in 4 weeks for laser # 2.

## 2024-05-07 IMAGING — CT CT ABD-PELV W/ CM
2 of 5 series · 16 of 46 positions shown, 18 images · IV contrast (APPLIED)
Comparison: None Available.

CLINICAL DATA: Right lower quadrant abdominal pain.

EXAM:
CT ABDOMEN AND PELVIS WITH CONTRAST
TECHNIQUE: Multidetector CT imaging of the abdomen and pelvis was performed
using the standard protocol following bolus administration of
intravenous contrast.

[Series 2: abd pel w · axial · 0.74mm/px · z∈[+812,+1242]mm · 13 of 98 slices shown, 15 images]
[im 6/98  soft-tissue]
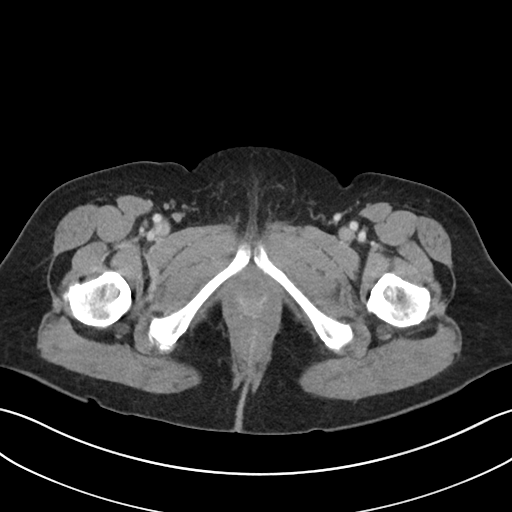
[im 6/98  bone]
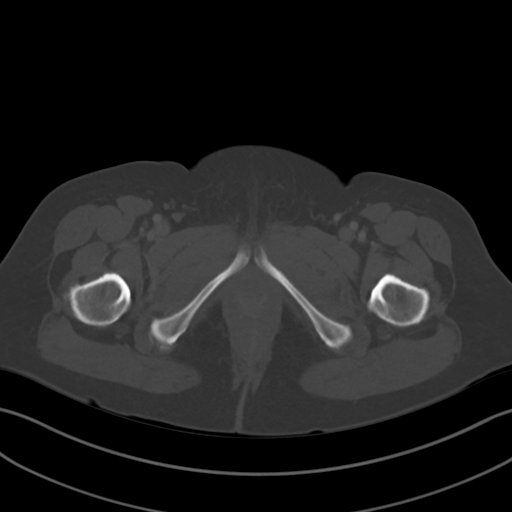
[im 11/98  soft-tissue]
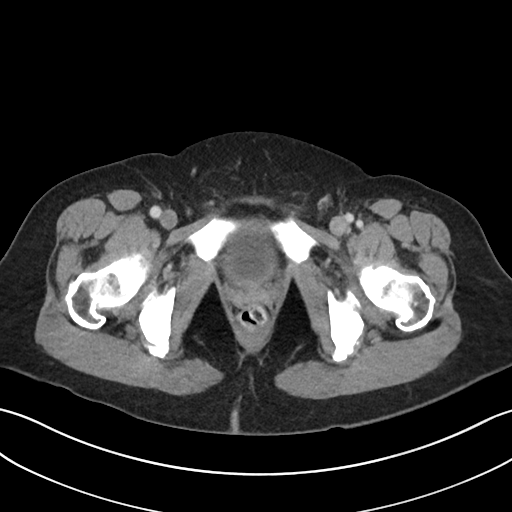
[im 22/98  soft-tissue]
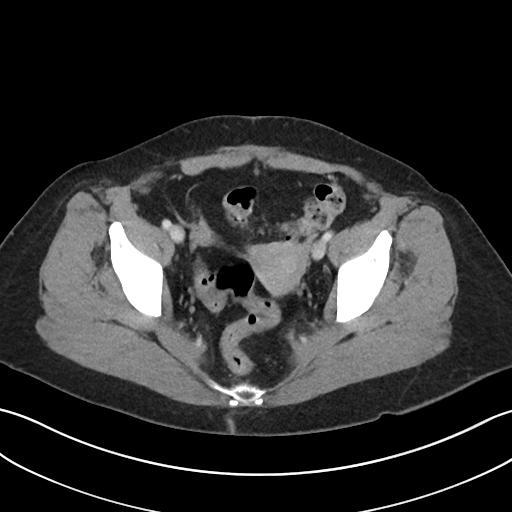
[im 27/98  soft-tissue]
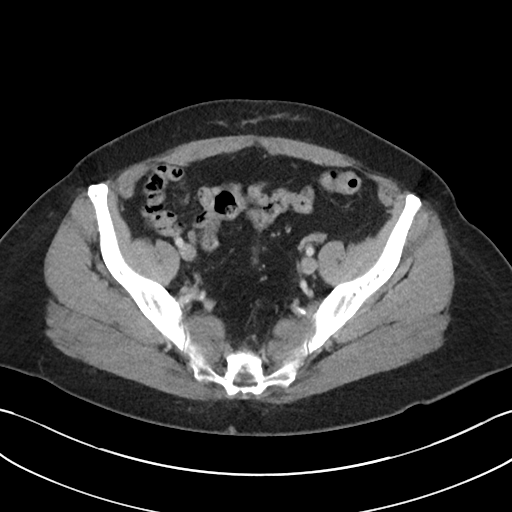
[im 33/98  soft-tissue]
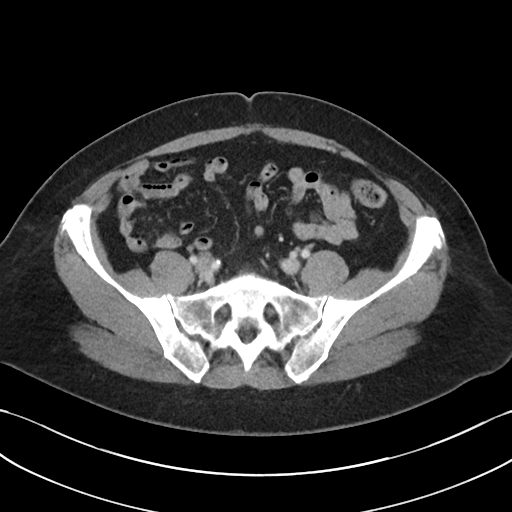
[im 44/98  soft-tissue]
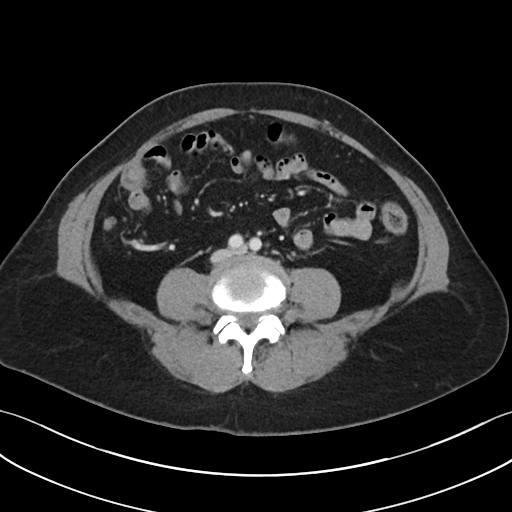
[im 49/98  soft-tissue]
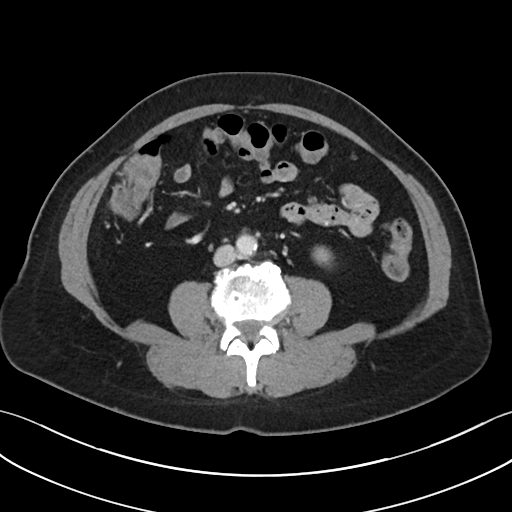
[im 54/98  soft-tissue]
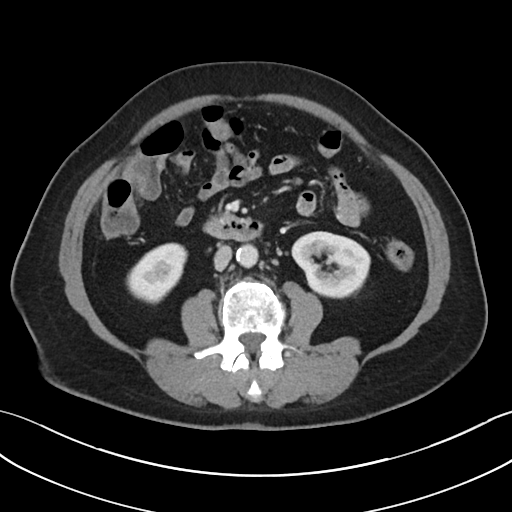
[im 65/98  soft-tissue]
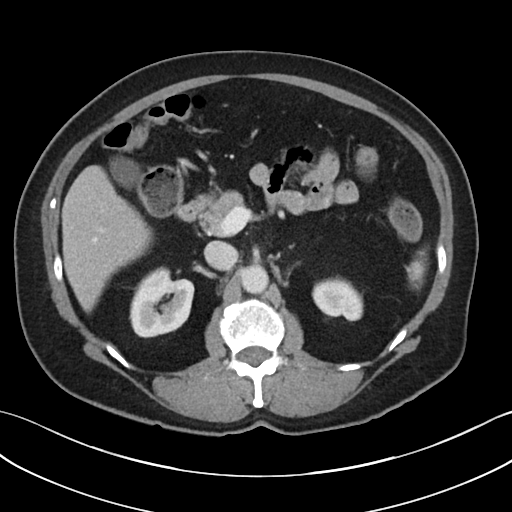
[im 65/98  bone]
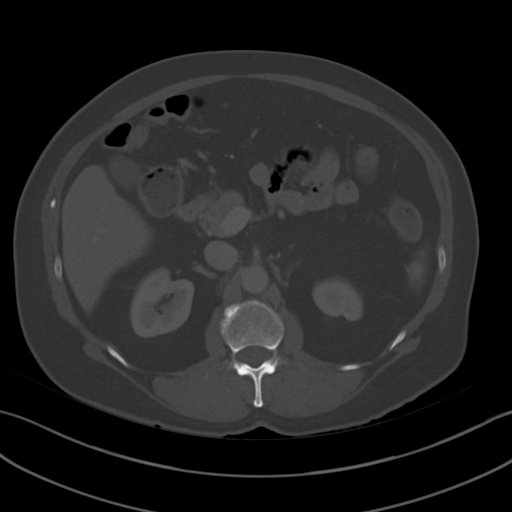
[im 71/98  soft-tissue]
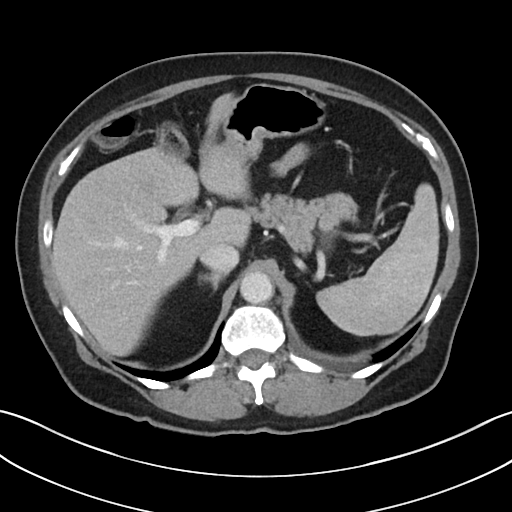
[im 76/98  soft-tissue]
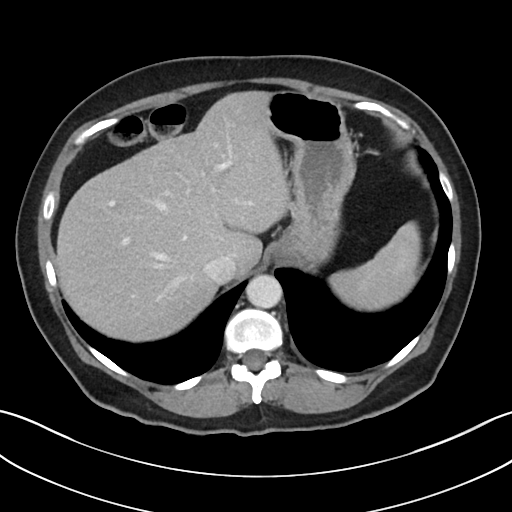
[im 87/98  soft-tissue]
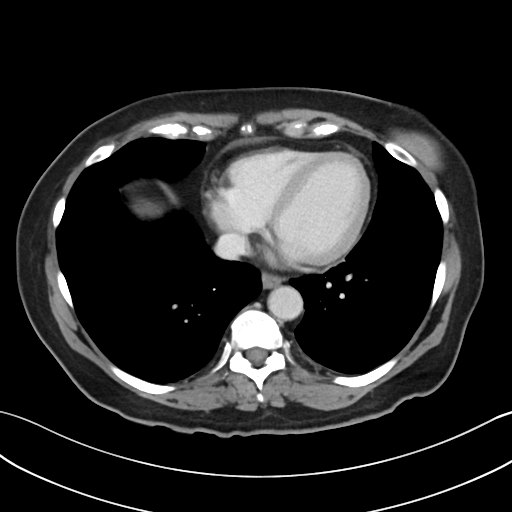
[im 92/98  soft-tissue]
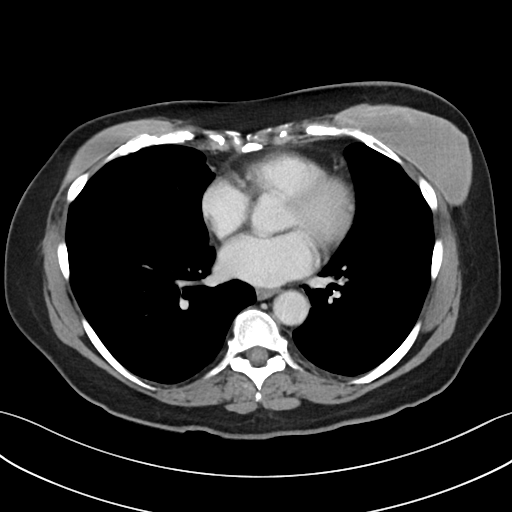

[Series 5: coronal · coronal · 0.73mm/px · 3 of 97 slices shown]
[im 33/97  soft-tissue]
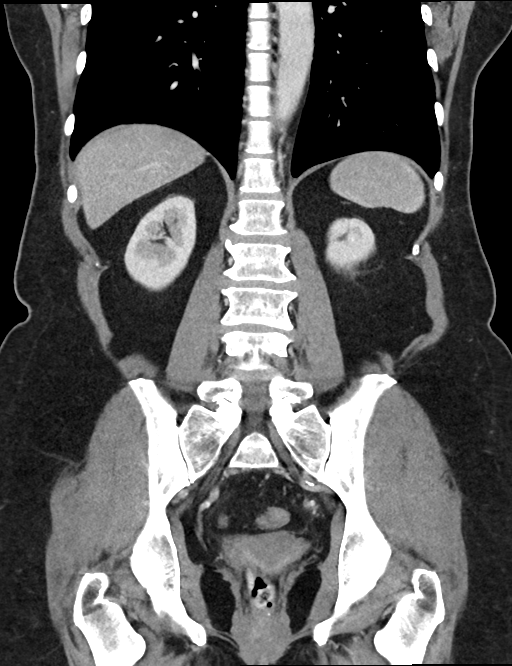
[im 43/97  soft-tissue]
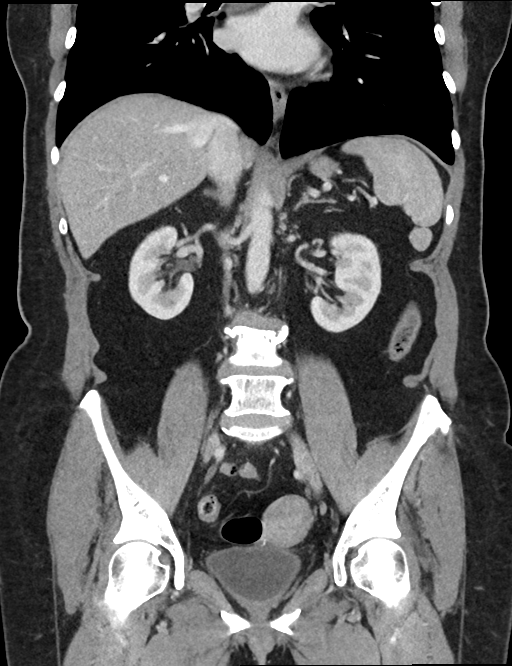
[im 54/97  soft-tissue]
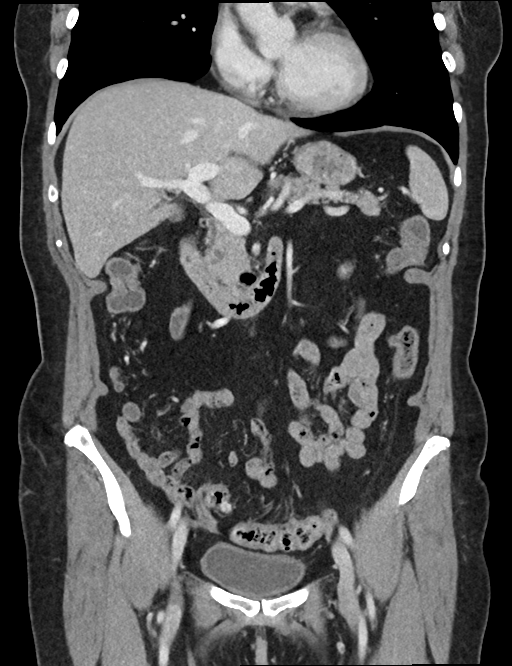

[16 of 46 positions shown; findings below may reference images not displayed]

RADIATION DOSE REDUCTION: This exam was performed according to the
departmental dose-optimization program which includes automated
exposure control, adjustment of the mA and/or kV according to
patient size and/or use of iterative reconstruction technique.

CONTRAST:  80mL OMNIPAQUE IOHEXOL 300 MG/ML  SOLN
FINDINGS: Lower chest: The visualized lung bases are clear.

No intra-abdominal free air or free fluid.

Hepatobiliary: No focal liver abnormality is seen. No gallstones,
gallbladder wall thickening, or biliary dilatation.

Pancreas: Unremarkable. No pancreatic ductal dilatation or
surrounding inflammatory changes.

Spleen: Normal in size without focal abnormality.

Adrenals/Urinary Tract: The adrenal glands unremarkable. There is no
hydronephrosis on either side. There is symmetric enhancement and
excretion of contrast by both kidneys. Focal area of scarring in the
upper pole of the left kidney. The visualized ureters and urinary
bladder appear unremarkable.

Stomach/Bowel: There is sigmoid diverticulosis without active
inflammatory changes. There is no bowel obstruction or active
inflammation. The appendix is normal.

Vascular/Lymphatic: Mild aortoiliac atherosclerotic disease. The IVC
is unremarkable. No portal venous gas. There is no adenopathy.

Reproductive: The uterus is anteverted. Probable small uterine
fibroid. No adnexal masses.

Other: Partially visualized bilateral breast implants.

Musculoskeletal: Degenerative changes of the spine. No acute osseous
pathology.
IMPRESSION: 1. No acute intra-abdominal or pelvic pathology. Normal appendix.
2. Sigmoid diverticulosis.
3. Aortic Atherosclerosis ([id number].F).

## 2024-05-11 DIAGNOSIS — Z23 Encounter for immunization: Secondary | ICD-10-CM | POA: Diagnosis not present

## 2024-05-11 DIAGNOSIS — F331 Major depressive disorder, recurrent, moderate: Secondary | ICD-10-CM | POA: Diagnosis not present

## 2024-05-11 DIAGNOSIS — K5900 Constipation, unspecified: Secondary | ICD-10-CM | POA: Diagnosis not present

## 2024-05-11 DIAGNOSIS — G4700 Insomnia, unspecified: Secondary | ICD-10-CM | POA: Diagnosis not present

## 2024-05-11 DIAGNOSIS — K219 Gastro-esophageal reflux disease without esophagitis: Secondary | ICD-10-CM | POA: Diagnosis not present

## 2024-05-11 DIAGNOSIS — F411 Generalized anxiety disorder: Secondary | ICD-10-CM | POA: Diagnosis not present

## 2024-05-21 DIAGNOSIS — Z23 Encounter for immunization: Secondary | ICD-10-CM | POA: Diagnosis not present

## 2024-05-21 DIAGNOSIS — Z7185 Encounter for immunization safety counseling: Secondary | ICD-10-CM | POA: Diagnosis not present

## 2024-05-26 ENCOUNTER — Ambulatory Visit: Payer: Self-pay

## 2024-05-26 ENCOUNTER — Ambulatory Visit (INDEPENDENT_AMBULATORY_CARE_PROVIDER_SITE_OTHER): Payer: Self-pay

## 2024-05-26 DIAGNOSIS — B351 Tinea unguium: Secondary | ICD-10-CM

## 2024-05-26 NOTE — Progress Notes (Signed)
 Patient presents today for the 2nd laser treatment. Diagnosed with mycotic nail infection by Dr. Magdalen.   Toenail most affected right 3rd. The right 4th does have some thickening and discoloration but very minimal.  All other systems are negative.  Nails were filed thin. Laser therapy was administered to 1-5 toenails bilaterally and patient tolerated the treatment well. All safety precautions were in place.   Post treatment instructions reviewed and provided to patient. Patient had no questions regarding plan of care.   Follow up in 4 weeks for laser # 3.

## 2024-06-17 DIAGNOSIS — Z0000 Encounter for general adult medical examination without abnormal findings: Secondary | ICD-10-CM | POA: Diagnosis not present

## 2024-06-17 DIAGNOSIS — Z636 Dependent relative needing care at home: Secondary | ICD-10-CM | POA: Diagnosis not present

## 2024-06-17 DIAGNOSIS — Z1331 Encounter for screening for depression: Secondary | ICD-10-CM | POA: Diagnosis not present

## 2024-06-17 DIAGNOSIS — Z1339 Encounter for screening examination for other mental health and behavioral disorders: Secondary | ICD-10-CM | POA: Diagnosis not present

## 2024-06-23 ENCOUNTER — Ambulatory Visit

## 2024-08-18 ENCOUNTER — Ambulatory Visit (INDEPENDENT_AMBULATORY_CARE_PROVIDER_SITE_OTHER): Payer: Self-pay

## 2024-08-18 DIAGNOSIS — B351 Tinea unguium: Secondary | ICD-10-CM

## 2024-08-18 NOTE — Progress Notes (Signed)
 Patient presents today for the 3rd laser treatment. Diagnosed with mycotic nail infection by Dr. Magdalen.   Toenail most affected Right 3rd and 4th. Both still present discoloration and some thickening. All other systems are negative.  Nails were filed thin. Laser therapy was administered to 1-5 toenails Bilaterally and patient tolerated the treatment well. All safety precautions were in place.   Post treatment instructions reviewed and provided to patient. Patient had no questions regarding plan of care.   Follow up in 6 weeks for laser # 4.

## 2024-10-13 ENCOUNTER — Ambulatory Visit
